# Patient Record
Sex: Female | Born: 1995 | Race: White | Hispanic: No | Marital: Single
Health system: Midwestern US, Academic
[De-identification: ages and names within clinical notes are randomized; demographics above are authoritative.]

---

## 2016-11-15 ENCOUNTER — Encounter: Admit: 2016-11-15 | Discharge: 2016-11-15 | Payer: MEDICAID

## 2016-11-15 NOTE — Progress Notes
Update to care everywhere. Patient scheduled for initial consult on 11.13.18. LMP 9.28.18,  Hep C.  Attempted to contact patient to inquire where Hep C was diagnosed and inquire of medical/pregnancy history.  No answer, LVM to call [facility] nurse.

## 2016-11-19 ENCOUNTER — Encounter: Admit: 2016-11-19 | Discharge: 2016-11-19 | Payer: MEDICAID

## 2016-11-19 DIAGNOSIS — O98419 Viral hepatitis complicating pregnancy, unspecified trimester: Principal | ICD-10-CM

## 2016-11-20 ENCOUNTER — Ambulatory Visit: Admit: 2016-11-20 | Discharge: 2016-11-21 | Payer: MEDICAID

## 2016-11-20 ENCOUNTER — Encounter: Admit: 2016-11-20 | Discharge: 2016-11-20 | Payer: MEDICAID

## 2016-11-20 DIAGNOSIS — O0991 Supervision of high risk pregnancy, unspecified, first trimester: Principal | ICD-10-CM

## 2016-11-20 DIAGNOSIS — B182 Chronic viral hepatitis C: ICD-10-CM

## 2016-11-20 DIAGNOSIS — Z8619 Personal history of other infectious and parasitic diseases: ICD-10-CM

## 2016-11-20 DIAGNOSIS — O98419 Viral hepatitis complicating pregnancy, unspecified trimester: Principal | ICD-10-CM

## 2016-11-20 DIAGNOSIS — F1510 Other stimulant abuse, uncomplicated: ICD-10-CM

## 2016-11-20 DIAGNOSIS — F1110 Opioid abuse, uncomplicated: ICD-10-CM

## 2016-11-20 LAB — CBC
Lab: 13 K/UL — ABNORMAL HIGH (ref 4.5–11.0)
Lab: 4.2 M/UL (ref 4.0–5.0)

## 2016-11-20 NOTE — Progress Notes
1. Have you or your sexual partner traveled away from [place] in the last 12 weeks? No
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
## 2016-11-21 LAB — COMPREHENSIVE METABOLIC PANEL
Lab: 0.5 mg/dL (ref >60)
Lab: 136 MMOL/L — ABNORMAL LOW (ref 137–147)
Lab: 14 U/L (ref 7–56)
Lab: 20 U/L (ref 7–40)
Lab: 22 MMOL/L (ref 21–30)
Lab: 3.9 MMOL/L (ref 3.5–5.1)
Lab: 4.4 g/dL (ref 3.5–5.0)
Lab: 6 (ref 3–12)
Lab: 83 mg/dL — ABNORMAL HIGH (ref 70–100)
Lab: >60 mL/min (ref >60)

## 2016-11-21 LAB — HEMOGLOBIN A1C: Lab: 5.8 % (ref 4.0–6.0)

## 2016-11-21 LAB — RUBELLA AB IGG

## 2016-11-21 LAB — HIV 1& 2 AG-AB SCRN W REFLEX HIV 1 PCR QUANT: Lab: NEGATIVE mg/dL — ABNORMAL LOW (ref 0.3–1.2)

## 2016-11-21 LAB — HEPATITIS C VIRAL LOAD PCR QUANT

## 2016-11-21 LAB — HEPATITIS B SURFACE AG: Lab: NEGATIVE mg/dL (ref 7–25)
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
## 2016-11-22 ENCOUNTER — Ambulatory Visit: Admit: 2016-11-22 | Discharge: 2016-11-22 | Payer: MEDICAID

## 2016-11-22 DIAGNOSIS — O0991 Supervision of high risk pregnancy, unspecified, first trimester: Principal | ICD-10-CM

## 2016-11-22 DIAGNOSIS — Z8619 Personal history of other infectious and parasitic diseases: ICD-10-CM

## 2016-11-22 LAB — CULTURE-URINE W/SENSITIVITY

## 2016-11-26 NOTE — Progress Notes
[facility] nurse noted    Algernon Huxley, MD sent to [facility]    She is likely immune, having cleared hep C. We will check additional viral loads in 2nd and 3rd trimester to confirm.

## 2016-12-11 ENCOUNTER — Ambulatory Visit: Admit: 2016-12-11 | Discharge: 2016-12-11 | Payer: MEDICAID

## 2016-12-11 ENCOUNTER — Ambulatory Visit: Admit: 2016-12-11 | Discharge: 2016-12-12 | Payer: MEDICAID

## 2016-12-11 ENCOUNTER — Encounter: Admit: 2016-12-11 | Discharge: 2016-12-11 | Payer: MEDICAID

## 2016-12-11 DIAGNOSIS — Z87898 Personal history of other specified conditions: Principal | ICD-10-CM

## 2016-12-11 DIAGNOSIS — Z3490 Encounter for supervision of normal pregnancy, unspecified, unspecified trimester: ICD-10-CM

## 2016-12-11 DIAGNOSIS — R7309 Other abnormal glucose: ICD-10-CM

## 2016-12-11 LAB — HEPATITIS B SURFACE AB

## 2016-12-11 LAB — HEPATITIS C ANTIBODY W REFLEX HCV PCR QUANT: Lab: NEGATIVE

## 2016-12-11 LAB — HEPATITIS A IGG: Lab: NEGATIVE

## 2016-12-12 LAB — CANNABINOIDS-URINE RANDOM: Lab: NEGATIVE

## 2016-12-12 LAB — AMPHETAMINES-URINE RANDOM: Lab: NEGATIVE

## 2016-12-12 LAB — OPIATES-URINE RANDOM: Lab: NEGATIVE

## 2016-12-12 LAB — CHLAM/NG PCR URINE
Lab: NEGATIVE
Lab: NEGATIVE

## 2016-12-12 LAB — BARBITURATES-URINE RANDOM: Lab: NEGATIVE

## 2016-12-12 LAB — COCAINE-URINE RANDOM: Lab: NEGATIVE

## 2016-12-12 LAB — SYPHILIS AB SCREEN: Lab: NEGATIVE

## 2016-12-12 LAB — PHENCYCLIDINES-URINE RANDOM: Lab: NEGATIVE

## 2016-12-12 LAB — BENZODIAZEPINES-URINE RANDOM: Lab: NEGATIVE

## 2016-12-14 NOTE — Progress Notes
Yvonne Armstrong presents for an ultrasound encounter. Past Medical, Surgical, Family & Social History; Medications & Allergies contained in the electronic record below were not reviewed today and may not be up-to-date. Please see [facility] report for all documentation related to this encounter.    12/14/2016  Kathyrn Lass, MA

## 2016-12-28 ENCOUNTER — Encounter: Admit: 2016-12-28 | Discharge: 2016-12-28 | Payer: MEDICAID

## 2016-12-28 DIAGNOSIS — O99331 Smoking (tobacco) complicating pregnancy, first trimester: ICD-10-CM

## 2016-12-28 DIAGNOSIS — Z87898 Personal history of other specified conditions: ICD-10-CM

## 2016-12-28 DIAGNOSIS — Z8619 Personal history of other infectious and parasitic diseases: ICD-10-CM

## 2016-12-28 DIAGNOSIS — Z3682 Encounter for antenatal screening for nuchal translucency: Principal | ICD-10-CM

## 2016-12-31 NOTE — Progress Notes
High Risk OB - Return Prenatal Visit  ???  Subjective:  Yvonne Armstrong???is a 21 y.o.???F G2P0100 at [redacted]w[redacted]d. ???Estimated Date of Delivery: 07/14/16. ???She presents today as a follow-up visit for history of hepatitis C and arnold-chiari malformation.  ???  +FM, -VB/LOF/CTX  ???  Objective:    ???  Vitals:    01/02/17 0931   BP: 118/68   Pulse: 91       Gen: NAD  CV: regular rate  Lungs: non-labored  Abd: soft, nttp  Ext: no LE edema, nttp BLE    01/02/17 [facility] U/S: NT 2.1 mm, no soft markers.     Prenatal Labs:  A POS, Ab neg  Rubella Immune  HIV Neg  G/C Neg/Neg  Syph Ab Neg  HBsAg Neg  Hep C viral load Neg  ???  Assessment  21 y.o.???F G1P0 @ [redacted]w[redacted]d by ultrasound 11/20/2016. Estimated Date of Delivery: 07/14/16.  History of HepC - likely cleared  H/o Heroin, Amphetamine use  Prior arnold chiari symptoms - none at moment.  ???  P:   Hepatitis C immune:  ??? Is not currently managed or treated. Was diagnosed in 2012 by [facility]   ??? Lab testing at initial visit c/w cleared infection - IgG neg, viral load neg  ??? LFTs each trimester - AST/ALT 20/14 (11/13)   ??? Normal abdominal U/S  ???  Tobacco Use  - counseled for cessation.    Prior Heroin/amphetamine use  - reviewed risk for IUGR and stillbirth with heroin.  Methamphetamine use is associated also with IUGR and placental abruption/stillbirth.  - reviewed options for methadone/buprenorphine therapy for opioid abuse.  - urine drug screen should be obtained periodically during prenatal care, requested 01/02/17  - growth u/s at minimum at 28 and 34-35 wks.  If actively using, should be q4 and weekly antenatal surveillance started if IUGR diagnosed.     Possible Chiari Malformation  - will request records from [facility]  - referral to [facility]  ???  Pregnancy:  ??? PNV  ??? PNLs, urine culture neg, urine G/C wnl  ??? Cervical cancer screening - pap in 2017 at [facility], patient said it was normal   ??? Request [facility] smear records [facility] ??? NT wnl - declined biochem.  Wants carrier screening.   ??? Recommend msAFP @16 -22wga  ??? Detailed [place] @20 -22wga  ??? 2hr GTT, CBC, Ab screen, syph ab @28  wks  ??? Recommend Tdap in 3rd trimester  ??? Collect GBS @35wga   ??? PP BCM: Discuss in third trimester   ??? Delivery: Discuss in third trimester   ???  RTC in 2 weeks    Patient seen and discussed with Dr. Ok Anis, MD  Obstetrics and Gynecology, PGY2  Pager - [phone]    I personally performed the key portions of the E/M visit, discussed case with Dr. Keane Police and concur with resident documentation of history, physical exam, assessment, and treatment plan unless otherwise noted.  IUP at [redacted]w[redacted]d - UDS for prior hx of heroin use.  Possible chiari hx - [facility].  Pregnancy - declined biochem, wants carrier screening. Finish 2h GTT today.  RTC 2 wks. msAFP then.  Offer vaccination for HepB and hepA next visit.   Algernon Huxley, MD              ???  ???

## 2017-01-02 ENCOUNTER — Ambulatory Visit: Admit: 2017-01-02 | Discharge: 2017-01-03 | Payer: MEDICAID

## 2017-01-02 ENCOUNTER — Encounter: Admit: 2017-01-02 | Discharge: 2017-01-02 | Payer: MEDICAID

## 2017-01-02 DIAGNOSIS — Z87898 Personal history of other specified conditions: ICD-10-CM

## 2017-01-02 DIAGNOSIS — F1110 Opioid abuse, uncomplicated: ICD-10-CM

## 2017-01-02 DIAGNOSIS — O0991 Supervision of high risk pregnancy, unspecified, first trimester: Principal | ICD-10-CM

## 2017-01-02 DIAGNOSIS — Z8669 Personal history of other diseases of the nervous system and sense organs: ICD-10-CM

## 2017-01-02 DIAGNOSIS — Q0700 Arnold-Chiari syndrome without spina bifida or hydrocephalus: ICD-10-CM

## 2017-01-02 DIAGNOSIS — F1510 Other stimulant abuse, uncomplicated: ICD-10-CM

## 2017-01-02 DIAGNOSIS — R7309 Other abnormal glucose: ICD-10-CM

## 2017-01-02 DIAGNOSIS — Z3682 Encounter for antenatal screening for nuchal translucency: Principal | ICD-10-CM

## 2017-01-02 DIAGNOSIS — Z3490 Encounter for supervision of normal pregnancy, unspecified, unspecified trimester: Principal | ICD-10-CM

## 2017-01-02 DIAGNOSIS — Z8619 Personal history of other infectious and parasitic diseases: ICD-10-CM

## 2017-01-02 DIAGNOSIS — O99331 Smoking (tobacco) complicating pregnancy, first trimester: ICD-10-CM

## 2017-01-02 DIAGNOSIS — Z3A12 12 weeks gestation of pregnancy: ICD-10-CM

## 2017-01-02 LAB — GLUCOSE TOL-2 HR: Lab: 106 mg/dL

## 2017-01-02 LAB — GLUCOSE, FASTING: Lab: 78 mg/dL (ref 70–100)

## 2017-01-02 LAB — GLUCOSE TOL-1 HR: Lab: 102 mg/dL

## 2017-01-02 NOTE — Progress Notes
1. Have you or your sexual partner traveled away from [place] in the last 12 weeks? No

## 2017-01-15 ENCOUNTER — Encounter: Admit: 2017-01-15 | Discharge: 2017-01-15 | Payer: MEDICAID

## 2017-01-16 NOTE — Progress Notes
Yvonne Armstrong presents for an ultrasound encounter. Past Medical, Surgical, Family & Social History; Medications & Allergies contained in the electronic record below were not reviewed today and may not be up-to-date. Please see [facility] report for all documentation related to this encounter.    01/16/2017  Kathyrn Lass, MA

## 2017-01-21 ENCOUNTER — Encounter: Admit: 2017-01-21 | Discharge: 2017-01-21 | Payer: MEDICAID

## 2017-01-21 DIAGNOSIS — Z1379 Encounter for other screening for genetic and chromosomal anomalies: Principal | ICD-10-CM

## 2017-01-24 ENCOUNTER — Encounter: Admit: 2017-01-24 | Discharge: 2017-01-24 | Payer: MEDICAID

## 2017-02-28 ENCOUNTER — Encounter: Admit: 2017-02-28 | Discharge: 2017-02-28 | Payer: MEDICAID

## 2017-02-28 ENCOUNTER — Ambulatory Visit: Admit: 2017-02-28 | Discharge: 2017-03-01 | Payer: MEDICAID

## 2017-02-28 DIAGNOSIS — Z363 Encounter for antenatal screening for malformations: Principal | ICD-10-CM

## 2017-02-28 DIAGNOSIS — R4189 Other symptoms and signs involving cognitive functions and awareness: Principal | ICD-10-CM

## 2017-02-28 DIAGNOSIS — H547 Unspecified visual loss: ICD-10-CM

## 2017-02-28 DIAGNOSIS — R51 Headache: ICD-10-CM

## 2017-02-28 MED ORDER — MAGNESIUM OXIDE 400 MG (241.3 MG MAGNESIUM) PO TAB
400 mg | ORAL_TABLET | Freq: Two times a day (BID) | ORAL | 3 refills | Status: SS
Start: 2017-02-28 — End: 2017-07-08

## 2017-02-28 MED ORDER — RIBOFLAVIN (VITAMIN B2) 400 MG PO TAB
400 mg | ORAL_TABLET | Freq: Every evening | ORAL | 3 refills | Status: AC
Start: 2017-02-28 — End: 2017-03-11

## 2017-03-01 ENCOUNTER — Encounter: Admit: 2017-03-01 | Discharge: 2017-03-01 | Payer: MEDICAID

## 2017-03-01 DIAGNOSIS — Z8669 Personal history of other diseases of the nervous system and sense organs: ICD-10-CM

## 2017-03-01 DIAGNOSIS — Q0700 Arnold-Chiari syndrome without spina bifida or hydrocephalus: Principal | ICD-10-CM

## 2017-03-09 ENCOUNTER — Ambulatory Visit: Admit: 2017-03-09 | Discharge: 2017-03-09 | Payer: MEDICAID

## 2017-03-09 DIAGNOSIS — Q0700 Arnold-Chiari syndrome without spina bifida or hydrocephalus: Principal | ICD-10-CM

## 2017-03-11 ENCOUNTER — Encounter: Admit: 2017-03-11 | Discharge: 2017-03-11 | Payer: MEDICAID

## 2017-03-11 MED ORDER — RIBOFLAVIN (VITAMIN B2) 100 MG PO TAB
ORAL_TABLET | 3 refills | Status: SS
Start: 2017-03-11 — End: 2017-07-08

## 2017-03-21 ENCOUNTER — Encounter: Admit: 2017-03-21 | Discharge: 2017-03-21 | Payer: MEDICAID

## 2017-03-21 DIAGNOSIS — G935 Compression of brain: Principal | ICD-10-CM

## 2017-04-09 ENCOUNTER — Ambulatory Visit: Admit: 2017-04-09 | Discharge: 2017-04-09 | Payer: MEDICAID

## 2017-04-09 DIAGNOSIS — Q0700 Arnold-Chiari syndrome without spina bifida or hydrocephalus: ICD-10-CM

## 2017-04-09 DIAGNOSIS — O98419 Viral hepatitis complicating pregnancy, unspecified trimester: ICD-10-CM

## 2017-04-09 DIAGNOSIS — F1510 Other stimulant abuse, uncomplicated: Principal | ICD-10-CM

## 2017-04-09 DIAGNOSIS — F1110 Opioid abuse, uncomplicated: ICD-10-CM

## 2017-04-16 ENCOUNTER — Ambulatory Visit: Admit: 2017-04-16 | Discharge: 2017-04-16 | Payer: MEDICAID

## 2017-04-16 ENCOUNTER — Encounter: Admit: 2017-04-16 | Discharge: 2017-04-16 | Payer: MEDICAID

## 2017-04-16 DIAGNOSIS — F1990 Other psychoactive substance use, unspecified, uncomplicated: ICD-10-CM

## 2017-04-16 DIAGNOSIS — Z131 Encounter for screening for diabetes mellitus: ICD-10-CM

## 2017-04-16 DIAGNOSIS — Z3A28 28 weeks gestation of pregnancy: ICD-10-CM

## 2017-04-16 DIAGNOSIS — Z8619 Personal history of other infectious and parasitic diseases: Principal | ICD-10-CM

## 2017-04-16 DIAGNOSIS — R51 Headache: ICD-10-CM

## 2017-04-16 DIAGNOSIS — Q0700 Arnold-Chiari syndrome without spina bifida or hydrocephalus: ICD-10-CM

## 2017-04-16 DIAGNOSIS — R4189 Other symptoms and signs involving cognitive functions and awareness: Principal | ICD-10-CM

## 2017-04-16 DIAGNOSIS — H547 Unspecified visual loss: ICD-10-CM

## 2017-04-16 LAB — OPIATES 300 OR GREATER-URINE RANDOM: Lab: NEGATIVE

## 2017-04-16 LAB — COMPREHENSIVE METABOLIC PANEL
Lab: 0.2 mg/dL — ABNORMAL LOW (ref 0.3–1.2)
Lab: 101 U/L (ref 25–110)
Lab: 133 MMOL/L — ABNORMAL LOW (ref 137–147)
Lab: 14 U/L (ref 7–56)
Lab: 21 U/L (ref 7–40)
Lab: 22 MMOL/L (ref 21–30)
Lab: 3.8 g/dL (ref 3.5–5.0)
Lab: >60 mL/min (ref >60)
Lab: >60 mL/min (ref >60)
Lab: 7 (ref 3–12)

## 2017-04-16 LAB — PHENCYCLIDINES-URINE RANDOM: Lab: NEGATIVE

## 2017-04-16 LAB — METHADONE-URINE SCREEN: Lab: NEGATIVE

## 2017-04-16 LAB — CANNABINOIDS-URINE RANDOM: Lab: NEGATIVE

## 2017-04-16 LAB — AMPHETAMINES-URINE RANDOM: Lab: NEGATIVE

## 2017-04-16 LAB — BENZODIAZEPINES-URINE RANDOM: Lab: NEGATIVE

## 2017-04-16 LAB — BARBITURATES-URINE RANDOM: Lab: NEGATIVE

## 2017-04-16 LAB — OXYCODONE URINE SCREEN: Lab: NEGATIVE

## 2017-04-16 LAB — COCAINE-URINE RANDOM: Lab: NEGATIVE

## 2017-04-17 LAB — HEPATITIS C VIRAL LOAD PCR QUANT

## 2017-04-17 MED ORDER — BUPROPION SR 150 MG PO TBSR
150 mg | ORAL_TABLET | Freq: Two times a day (BID) | ORAL | 3 refills | Status: SS
Start: 2017-04-17 — End: 2017-07-08

## 2017-05-02 ENCOUNTER — Encounter: Admit: 2017-05-02 | Discharge: 2017-05-02 | Payer: MEDICAID

## 2017-05-03 ENCOUNTER — Encounter: Admit: 2017-05-03 | Discharge: 2017-05-03 | Payer: MEDICAID

## 2017-05-03 DIAGNOSIS — Q0700 Arnold-Chiari syndrome without spina bifida or hydrocephalus: ICD-10-CM

## 2017-05-03 DIAGNOSIS — F1110 Opioid abuse, uncomplicated: ICD-10-CM

## 2017-05-03 DIAGNOSIS — Z8619 Personal history of other infectious and parasitic diseases: Principal | ICD-10-CM

## 2017-05-03 DIAGNOSIS — F1510 Other stimulant abuse, uncomplicated: ICD-10-CM

## 2017-05-07 ENCOUNTER — Encounter: Admit: 2017-05-07 | Discharge: 2017-05-07 | Payer: MEDICAID

## 2017-05-07 ENCOUNTER — Ambulatory Visit: Admit: 2017-05-07 | Discharge: 2017-05-08 | Payer: MEDICAID

## 2017-05-07 DIAGNOSIS — F1110 Opioid abuse, uncomplicated: ICD-10-CM

## 2017-05-07 DIAGNOSIS — O98419 Viral hepatitis complicating pregnancy, unspecified trimester: Principal | ICD-10-CM

## 2017-05-07 DIAGNOSIS — Q0700 Arnold-Chiari syndrome without spina bifida or hydrocephalus: ICD-10-CM

## 2017-05-07 DIAGNOSIS — Z87898 Personal history of other specified conditions: ICD-10-CM

## 2017-05-07 DIAGNOSIS — O0993 Supervision of high risk pregnancy, unspecified, third trimester: ICD-10-CM

## 2017-05-07 DIAGNOSIS — F1510 Other stimulant abuse, uncomplicated: ICD-10-CM

## 2017-05-07 DIAGNOSIS — R4189 Other symptoms and signs involving cognitive functions and awareness: Principal | ICD-10-CM

## 2017-05-07 DIAGNOSIS — Z3A28 28 weeks gestation of pregnancy: ICD-10-CM

## 2017-05-07 DIAGNOSIS — H547 Unspecified visual loss: ICD-10-CM

## 2017-05-07 DIAGNOSIS — Z131 Encounter for screening for diabetes mellitus: ICD-10-CM

## 2017-05-07 DIAGNOSIS — R51 Headache: ICD-10-CM

## 2017-05-07 DIAGNOSIS — Z8619 Personal history of other infectious and parasitic diseases: Principal | ICD-10-CM

## 2017-05-07 LAB — CBC
Lab: 13 % (ref 11–15)
Lab: 16 10*3/uL — ABNORMAL HIGH (ref 4.5–11.0)
Lab: 3.5 M/UL — ABNORMAL LOW (ref 4.0–5.0)
Lab: 30 pg (ref 26–34)
Lab: 33 % — ABNORMAL LOW (ref 36–45)
Lab: 7.5 FL (ref 7–11)

## 2017-05-07 LAB — AMPHETAMINES-URINE RANDOM: Lab: NEGATIVE

## 2017-05-07 LAB — CANNABINOIDS-URINE RANDOM: Lab: NEGATIVE

## 2017-05-07 LAB — PHENCYCLIDINES-URINE RANDOM: Lab: NEGATIVE g/dL (ref 32.0–36.0)

## 2017-05-07 LAB — GLUCOSE TOL-1 HR: Lab: 101 mg/dL — ABNORMAL LOW (ref 12.0–15.0)

## 2017-05-07 LAB — BENZODIAZEPINES-URINE RANDOM: Lab: NEGATIVE

## 2017-05-07 LAB — BARBITURATES-URINE RANDOM: Lab: NEGATIVE

## 2017-05-07 LAB — OPIATES 300 OR GREATER-URINE RANDOM: Lab: NEGATIVE

## 2017-05-07 LAB — METHADONE-URINE SCREEN: Lab: NEGATIVE

## 2017-05-07 LAB — OXYCODONE URINE SCREEN: Lab: NEGATIVE 10*3/uL — ABNORMAL HIGH (ref 150–400)

## 2017-05-07 LAB — COCAINE-URINE RANDOM: Lab: NEGATIVE

## 2017-05-08 LAB — SYPHILIS AB SCREEN: Lab: NEGATIVE FL (ref 80–100)

## 2017-05-21 ENCOUNTER — Ambulatory Visit: Admit: 2017-05-21 | Discharge: 2017-05-22 | Payer: MEDICAID

## 2017-05-21 ENCOUNTER — Encounter: Admit: 2017-05-21 | Discharge: 2017-05-21 | Payer: MEDICAID

## 2017-05-21 DIAGNOSIS — O0993 Supervision of high risk pregnancy, unspecified, third trimester: Principal | ICD-10-CM

## 2017-05-21 DIAGNOSIS — Z87898 Personal history of other specified conditions: ICD-10-CM

## 2017-05-21 DIAGNOSIS — R51 Headache: ICD-10-CM

## 2017-05-21 DIAGNOSIS — R4189 Other symptoms and signs involving cognitive functions and awareness: Principal | ICD-10-CM

## 2017-05-21 DIAGNOSIS — H547 Unspecified visual loss: ICD-10-CM

## 2017-05-21 LAB — AMPHETAMINES-URINE RANDOM: Lab: NEGATIVE g/dL (ref 13.5–16.5)

## 2017-05-21 LAB — BARBITURATES-URINE RANDOM: Lab: NEGATIVE FL (ref 80–100)

## 2017-05-21 LAB — METHADONE-URINE SCREEN: Lab: NEGATIVE % (ref 4–12)

## 2017-05-21 LAB — CANNABINOIDS-URINE RANDOM: Lab: NEGATIVE K/UL (ref 150–400)

## 2017-05-21 LAB — BENZODIAZEPINES-URINE RANDOM: Lab: NEGATIVE g/dL (ref 32.0–36.0)

## 2017-05-21 LAB — OPIATES 300 OR GREATER-URINE RANDOM: Lab: NEGATIVE % (ref 0–2)

## 2017-05-21 LAB — PHENCYCLIDINES-URINE RANDOM: Lab: NEGATIVE 10*3/uL — ABNORMAL HIGH (ref 0–0.45)

## 2017-05-21 LAB — OXYCODONE URINE SCREEN: Lab: NEGATIVE 10*3/uL (ref 1.0–4.8)

## 2017-05-21 LAB — COCAINE-URINE RANDOM: Lab: NEGATIVE % (ref 41–77)

## 2017-05-23 ENCOUNTER — Encounter: Admit: 2017-05-23 | Discharge: 2017-05-23 | Payer: MEDICAID

## 2017-05-23 DIAGNOSIS — R51 Headache: ICD-10-CM

## 2017-05-23 DIAGNOSIS — R4189 Other symptoms and signs involving cognitive functions and awareness: Principal | ICD-10-CM

## 2017-05-23 DIAGNOSIS — H547 Unspecified visual loss: ICD-10-CM

## 2017-05-31 ENCOUNTER — Encounter: Admit: 2017-05-31 | Discharge: 2017-05-31 | Payer: MEDICAID

## 2017-05-31 DIAGNOSIS — Q0700 Arnold-Chiari syndrome without spina bifida or hydrocephalus: ICD-10-CM

## 2017-05-31 DIAGNOSIS — Z87898 Personal history of other specified conditions: Principal | ICD-10-CM

## 2017-05-31 DIAGNOSIS — O98419 Viral hepatitis complicating pregnancy, unspecified trimester: ICD-10-CM

## 2017-06-04 ENCOUNTER — Ambulatory Visit: Admit: 2017-06-04 | Discharge: 2017-06-05 | Payer: MEDICAID

## 2017-06-04 ENCOUNTER — Encounter: Admit: 2017-06-04 | Discharge: 2017-06-04 | Payer: MEDICAID

## 2017-06-04 DIAGNOSIS — Z87898 Personal history of other specified conditions: Principal | ICD-10-CM

## 2017-06-04 DIAGNOSIS — O98419 Viral hepatitis complicating pregnancy, unspecified trimester: ICD-10-CM

## 2017-06-04 DIAGNOSIS — R4189 Other symptoms and signs involving cognitive functions and awareness: Principal | ICD-10-CM

## 2017-06-04 DIAGNOSIS — F1110 Opioid abuse, uncomplicated: ICD-10-CM

## 2017-06-04 DIAGNOSIS — Z3A34 34 weeks gestation of pregnancy: ICD-10-CM

## 2017-06-04 DIAGNOSIS — Q0700 Arnold-Chiari syndrome without spina bifida or hydrocephalus: ICD-10-CM

## 2017-06-04 DIAGNOSIS — R51 Headache: ICD-10-CM

## 2017-06-04 DIAGNOSIS — B182 Chronic viral hepatitis C: ICD-10-CM

## 2017-06-04 DIAGNOSIS — F1510 Other stimulant abuse, uncomplicated: ICD-10-CM

## 2017-06-04 DIAGNOSIS — O0993 Supervision of high risk pregnancy, unspecified, third trimester: ICD-10-CM

## 2017-06-04 DIAGNOSIS — G935 Compression of brain: ICD-10-CM

## 2017-06-04 DIAGNOSIS — H547 Unspecified visual loss: ICD-10-CM

## 2017-06-04 LAB — LIVER FUNCTION PANEL
Lab: 0.2 mg/dL — ABNORMAL LOW (ref 0.3–1.2)
Lab: 10 U/L (ref 7–56)
Lab: 14 U/L (ref 7–40)
Lab: 183 U/L — ABNORMAL HIGH (ref 25–110)
Lab: 3.6 g/dL (ref 3.5–5.0)
Lab: 6.5 g/dL (ref 6.0–8.0)

## 2017-06-05 LAB — HEPATITIS C VIRAL LOAD PCR QUANT

## 2017-06-07 ENCOUNTER — Encounter: Admit: 2017-06-07 | Discharge: 2017-06-07 | Payer: MEDICAID

## 2017-06-07 DIAGNOSIS — R4189 Other symptoms and signs involving cognitive functions and awareness: Principal | ICD-10-CM

## 2017-06-07 DIAGNOSIS — H547 Unspecified visual loss: ICD-10-CM

## 2017-06-07 DIAGNOSIS — R51 Headache: ICD-10-CM

## 2017-06-19 ENCOUNTER — Encounter: Admit: 2017-06-19 | Discharge: 2017-06-19 | Payer: MEDICAID

## 2017-06-19 ENCOUNTER — Ambulatory Visit: Admit: 2017-06-19 | Discharge: 2017-06-20 | Payer: MEDICAID

## 2017-06-19 DIAGNOSIS — R51 Headache: ICD-10-CM

## 2017-06-19 DIAGNOSIS — R4189 Other symptoms and signs involving cognitive functions and awareness: Principal | ICD-10-CM

## 2017-06-19 DIAGNOSIS — Q0700 Arnold-Chiari syndrome without spina bifida or hydrocephalus: Principal | ICD-10-CM

## 2017-06-19 DIAGNOSIS — H547 Unspecified visual loss: ICD-10-CM

## 2017-06-28 ENCOUNTER — Encounter: Admit: 2017-06-28 | Discharge: 2017-06-28 | Payer: MEDICAID

## 2017-06-28 DIAGNOSIS — Q0700 Arnold-Chiari syndrome without spina bifida or hydrocephalus: ICD-10-CM

## 2017-06-28 DIAGNOSIS — O98419 Viral hepatitis complicating pregnancy, unspecified trimester: Principal | ICD-10-CM

## 2017-06-28 DIAGNOSIS — Z87898 Personal history of other specified conditions: ICD-10-CM

## 2017-07-02 ENCOUNTER — Encounter: Admit: 2017-07-02 | Discharge: 2017-07-02 | Payer: MEDICAID

## 2017-07-02 DIAGNOSIS — H547 Unspecified visual loss: ICD-10-CM

## 2017-07-02 DIAGNOSIS — R51 Headache: ICD-10-CM

## 2017-07-02 DIAGNOSIS — F1510 Other stimulant abuse, uncomplicated: Secondary | ICD-10-CM

## 2017-07-02 DIAGNOSIS — O99333 Smoking (tobacco) complicating pregnancy, third trimester: Principal | ICD-10-CM

## 2017-07-02 DIAGNOSIS — R4189 Other symptoms and signs involving cognitive functions and awareness: Principal | ICD-10-CM

## 2017-07-02 DIAGNOSIS — O98419 Viral hepatitis complicating pregnancy, unspecified trimester: Secondary | ICD-10-CM

## 2017-07-02 DIAGNOSIS — B182 Chronic viral hepatitis C: Secondary | ICD-10-CM

## 2017-07-02 DIAGNOSIS — O0990 Supervision of high risk pregnancy, unspecified, unspecified trimester: Secondary | ICD-10-CM

## 2017-07-02 LAB — CANNABINOIDS-URINE RANDOM: Lab: NEGATIVE

## 2017-07-02 LAB — COCAINE-URINE RANDOM: Lab: NEGATIVE

## 2017-07-02 LAB — BENZODIAZEPINES-URINE RANDOM: Lab: NEGATIVE

## 2017-07-02 LAB — OXYCODONE URINE SCREEN: Lab: NEGATIVE

## 2017-07-02 LAB — OPIATES 300 OR GREATER-URINE RANDOM: Lab: NEGATIVE

## 2017-07-02 LAB — METHADONE-URINE SCREEN: Lab: NEGATIVE

## 2017-07-02 LAB — BARBITURATES-URINE RANDOM: Lab: NEGATIVE

## 2017-07-02 LAB — PHENCYCLIDINES-URINE RANDOM: Lab: NEGATIVE

## 2017-07-02 LAB — AMPHETAMINES-URINE RANDOM: Lab: NEGATIVE

## 2017-07-03 ENCOUNTER — Ambulatory Visit: Admit: 2017-07-02 | Discharge: 2017-07-02 | Payer: MEDICAID

## 2017-07-03 ENCOUNTER — Encounter: Admit: 2017-07-02 | Discharge: 2017-07-03 | Payer: MEDICAID

## 2017-07-03 ENCOUNTER — Ambulatory Visit: Admit: 2017-07-02 | Discharge: 2017-07-03 | Payer: MEDICAID

## 2017-07-03 DIAGNOSIS — O98419 Viral hepatitis complicating pregnancy, unspecified trimester: Secondary | ICD-10-CM

## 2017-07-03 DIAGNOSIS — Z87898 Personal history of other specified conditions: Secondary | ICD-10-CM

## 2017-07-03 DIAGNOSIS — Z3A38 38 weeks gestation of pregnancy: ICD-10-CM

## 2017-07-03 DIAGNOSIS — Q0700 Arnold-Chiari syndrome without spina bifida or hydrocephalus: ICD-10-CM

## 2017-07-03 DIAGNOSIS — O0990 Supervision of high risk pregnancy, unspecified, unspecified trimester: ICD-10-CM

## 2017-07-03 DIAGNOSIS — O358XX0 Maternal care for other (suspected) fetal abnormality and damage, not applicable or unspecified: ICD-10-CM

## 2017-07-03 DIAGNOSIS — F17200 Nicotine dependence, unspecified, uncomplicated: ICD-10-CM

## 2017-07-03 DIAGNOSIS — O98513 Other viral diseases complicating pregnancy, third trimester: ICD-10-CM

## 2017-07-03 DIAGNOSIS — G935 Compression of brain: ICD-10-CM

## 2017-07-03 DIAGNOSIS — F1510 Other stimulant abuse, uncomplicated: Secondary | ICD-10-CM

## 2017-07-03 DIAGNOSIS — O99333 Smoking (tobacco) complicating pregnancy, third trimester: Principal | ICD-10-CM

## 2017-07-03 DIAGNOSIS — B182 Chronic viral hepatitis C: ICD-10-CM

## 2017-07-04 ENCOUNTER — Encounter: Admit: 2017-07-04 | Discharge: 2017-07-04 | Payer: MEDICAID

## 2017-07-04 LAB — CULTURE-STREP SCREEN

## 2017-07-07 ENCOUNTER — Inpatient Hospital Stay: Admit: 2017-07-07 | Discharge: 2017-07-09 | Disposition: A | Discharge status: Home / Self Care | Payer: MEDICAID

## 2017-07-07 ENCOUNTER — Encounter: Admit: 2017-07-07 | Discharge: 2017-07-07 | Payer: MEDICAID

## 2017-07-07 ENCOUNTER — Inpatient Hospital Stay: Admit: 2017-07-07 | Discharge: 2017-07-07 | Payer: MEDICAID

## 2017-07-07 DIAGNOSIS — O09293 Supervision of pregnancy with other poor reproductive or obstetric history, third trimester: ICD-10-CM

## 2017-07-07 DIAGNOSIS — H547 Unspecified visual loss: ICD-10-CM

## 2017-07-07 DIAGNOSIS — R51 Headache: ICD-10-CM

## 2017-07-07 DIAGNOSIS — J45909 Unspecified asthma, uncomplicated: ICD-10-CM

## 2017-07-07 DIAGNOSIS — Z3490 Encounter for supervision of normal pregnancy, unspecified, unspecified trimester: Secondary | ICD-10-CM

## 2017-07-07 DIAGNOSIS — G935 Compression of brain: ICD-10-CM

## 2017-07-07 DIAGNOSIS — R4189 Other symptoms and signs involving cognitive functions and awareness: Principal | ICD-10-CM

## 2017-07-07 LAB — CBC
Lab: 14 10*3/uL — ABNORMAL HIGH (ref 4.5–11.0)
Lab: 3.9 M/UL — ABNORMAL LOW (ref 4.0–5.0)
Lab: 548 10*3/uL — ABNORMAL HIGH (ref 150–400)

## 2017-07-07 LAB — RUBELLA AB IGG

## 2017-07-07 MED ORDER — SIMETHICONE 80 MG PO CHEW
80 mg | ORAL | 0 refills | Status: CN | PRN
Start: 2017-07-07 — End: ?

## 2017-07-07 MED ORDER — MISOPROSTOL 200 MCG PO TAB
800 ug | Freq: Once | RECTAL | 0 refills | Status: CP
Start: 2017-07-07 — End: ?
  Administered 2017-07-08: 03:00:00 800 ug via RECTAL

## 2017-07-07 MED ORDER — LANOLIN TP CREA
TOPICAL | 0 refills | Status: CN | PRN
Start: 2017-07-07 — End: ?

## 2017-07-07 MED ORDER — NALOXONE 0.4 MG/ML IJ SOLN
.08 mg | INTRAVENOUS | 0 refills | Status: DC | PRN
Start: 2017-07-07 — End: 2017-07-09

## 2017-07-07 MED ORDER — IBUPROFEN 600 MG PO TAB
600 mg | ORAL | 0 refills | Status: CN
Start: 2017-07-07 — End: ?

## 2017-07-07 MED ORDER — MAGNESIUM HYDROXIDE 2,400 MG/10 ML PO SUSP
10 mL | ORAL | 0 refills | Status: CN | PRN
Start: 2017-07-07 — End: ?

## 2017-07-07 MED ORDER — PENICILLIN G POTASSIUM 5MU/100ML NS IVPB
5 10*6.[IU] | Freq: Once | INTRAVENOUS | 0 refills | Status: CP
Start: 2017-07-07 — End: ?
  Administered 2017-07-07 (×2): 5 10*6.[IU] via INTRAVENOUS

## 2017-07-07 MED ORDER — OXYTOCIN IN 0.9 % SOD CHLORIDE 30 UNIT/500 ML IV SOLN
30 [IU] | Freq: Once | INTRAVENOUS | 0 refills | Status: CN | PRN
Start: 2017-07-07 — End: ?

## 2017-07-07 MED ORDER — DOCUSATE SODIUM 100 MG PO CAP
100 mg | Freq: Two times a day (BID) | ORAL | 0 refills | Status: CN
Start: 2017-07-07 — End: ?

## 2017-07-07 MED ORDER — FAMOTIDINE 20 MG PO TAB
20 mg | Freq: Two times a day (BID) | ORAL | 0 refills | Status: CN
Start: 2017-07-07 — End: ?

## 2017-07-07 MED ORDER — ACETAMINOPHEN 325 MG PO TAB
650 mg | ORAL | 0 refills | Status: CN | PRN
Start: 2017-07-07 — End: ?

## 2017-07-07 MED ORDER — LACTATED RINGERS IV SOLP
INTRAVENOUS | 0 refills | Status: DC
Start: 2017-07-07 — End: 2017-07-08
  Administered 2017-07-07 (×2): 1000.000 mL via INTRAVENOUS

## 2017-07-07 MED ORDER — PENICILLIN G POTASSIUM IVPB
2.5 10*6.[IU] | INTRAVENOUS | 0 refills | Status: DC
Start: 2017-07-07 — End: 2017-07-08
  Administered 2017-07-07 (×2): 2.5 10*6.[IU] via INTRAVENOUS

## 2017-07-07 MED ORDER — LIDOCAINE (PF) 10 MG/ML (1 %) IJ SOLN (OR)
0 refills | Status: DC
Start: 2017-07-07 — End: 2017-07-08

## 2017-07-07 MED ORDER — LIDOCAINE-EPINEPHRINE (PF) 1.5 %-1:200,000 IJ SOLN (OR)
0 refills | Status: DC
Start: 2017-07-07 — End: 2017-07-08

## 2017-07-07 MED ORDER — OXYTOCIN IN 0.9 % SOD CHLORIDE 30 UNIT/500 ML IV SOLN
24 [IU] | Freq: Once | INTRAVENOUS | 0 refills | Status: DC
Start: 2017-07-07 — End: 2017-07-08

## 2017-07-07 MED ORDER — BUPROPION XL 150 MG PO TB24
150 mg | Freq: Two times a day (BID) | ORAL | 0 refills | Status: DC
Start: 2017-07-07 — End: 2017-07-08

## 2017-07-07 MED ORDER — FENTANYL/BUPIVACAINE 2 MCG/ML-0.1% PCA EPIDURAL SYRINGE
EPIDURAL | 0 refills | Status: DC
Start: 2017-07-07 — End: 2017-07-08
  Administered 2017-07-07 – 2017-07-08 (×2): 50.000 mL via EPIDURAL

## 2017-07-07 MED ORDER — OXYTOCIN IN 0.9 % SOD CHLORIDE 30 UNIT/500 ML IV SOLN
0.5-20 m[IU]/min | INTRAVENOUS | 0 refills | Status: DC
Start: 2017-07-07 — End: 2017-07-08
  Administered 2017-07-07: 19:00:00 2 m[IU]/min via INTRAVENOUS

## 2017-07-07 MED ORDER — SODIUM CITRATE-CITRIC ACID 500-334 MG/5 ML PO SOLN
30 mL | Freq: Once | ORAL | 0 refills | Status: AC | PRN
Start: 2017-07-07 — End: ?

## 2017-07-07 MED ORDER — DIPHTH,PERTUS(ACELL),TETANUS 2.5-8-5 LF-MCG-LF/0.5ML IM SUSP
.5 mL | Freq: Once | INTRAMUSCULAR | 0 refills | Status: CN
Start: 2017-07-07 — End: ?

## 2017-07-07 MED ORDER — BUPIVACAINE (PF) 0.25 % (2.5 MG/ML) IJ SOLN
0 refills | Status: DC
Start: 2017-07-07 — End: 2017-07-08
  Administered 2017-07-07: 23:00:00 3 mL via EPIDURAL

## 2017-07-08 LAB — BLOOD GASES-CORD BLOOD
Lab: 46 % (ref 32.0–36.0)
Lab: 6.7 MMOL/L (ref 26–34)
Lab: 7.3 g/dL — ABNORMAL LOW (ref 12.0–15.0)

## 2017-07-08 LAB — CBC
Lab: 11 g/dL — ABNORMAL LOW (ref 12.0–15.0)
Lab: 14 % (ref 11–15)
Lab: 20 10*3/uL — ABNORMAL HIGH (ref >60)
Lab: 3.7 M/UL — ABNORMAL LOW (ref 4.0–5.0)
Lab: 30 pg (ref 26–34)
Lab: 33 % — ABNORMAL LOW (ref 36–45)
Lab: 33 g/dL (ref 32.0–36.0)
Lab: 89 FL (ref 80–100)

## 2017-07-08 LAB — SYPHILIS AB SCREEN: Lab: NEGATIVE mmHg — ABNORMAL LOW (ref 36–45)

## 2017-07-08 MED ORDER — SIMETHICONE 80 MG PO CHEW
80 mg | ORAL | 0 refills | Status: DC | PRN
Start: 2017-07-08 — End: 2017-07-09

## 2017-07-08 MED ORDER — OXYTOCIN IN 0.9 % SOD CHLORIDE 30 UNIT/500 ML IV SOLN
30 [IU] | Freq: Once | INTRAVENOUS | 0 refills | Status: AC | PRN
Start: 2017-07-08 — End: ?

## 2017-07-08 MED ORDER — LANOLIN TP CREA
TOPICAL | 0 refills | Status: DC | PRN
Start: 2017-07-08 — End: 2017-07-09

## 2017-07-08 MED ORDER — MAGNESIUM HYDROXIDE 2,400 MG/10 ML PO SUSP
10 mL | ORAL | 0 refills | Status: DC | PRN
Start: 2017-07-08 — End: 2017-07-09

## 2017-07-08 MED ORDER — DOCUSATE SODIUM 100 MG PO CAP
100 mg | Freq: Two times a day (BID) | ORAL | 0 refills | Status: DC
Start: 2017-07-08 — End: 2017-07-09
  Administered 2017-07-08 – 2017-07-09 (×3): 100 mg via ORAL

## 2017-07-08 MED ORDER — ACETAMINOPHEN 325 MG PO TAB
650 mg | ORAL | 0 refills | Status: DC | PRN
Start: 2017-07-08 — End: 2017-07-09
  Administered 2017-07-08 – 2017-07-09 (×3): 650 mg via ORAL

## 2017-07-08 MED ORDER — DIPHTH,PERTUS(ACELL),TETANUS 2.5-8-5 LF-MCG-LF/0.5ML IM SUSP
.5 mL | Freq: Once | INTRAMUSCULAR | 0 refills | Status: DC
Start: 2017-07-08 — End: 2017-07-08

## 2017-07-08 MED ORDER — FAMOTIDINE 20 MG PO TAB
20 mg | Freq: Two times a day (BID) | ORAL | 0 refills | Status: DC
Start: 2017-07-08 — End: 2017-07-09
  Administered 2017-07-09 (×2): 20 mg via ORAL

## 2017-07-08 MED ORDER — IBUPROFEN 600 MG PO TAB
600 mg | ORAL | 0 refills | Status: DC
Start: 2017-07-08 — End: 2017-07-09
  Administered 2017-07-08 – 2017-07-09 (×4): 600 mg via ORAL

## 2017-07-09 ENCOUNTER — Inpatient Hospital Stay: Admit: 2017-07-07 | Discharge: 2017-07-07 | Payer: MEDICAID

## 2017-07-09 DIAGNOSIS — O99824 Streptococcus B carrier state complicating childbirth: Principal | ICD-10-CM

## 2017-07-09 DIAGNOSIS — O99354 Diseases of the nervous system complicating childbirth: ICD-10-CM

## 2017-07-09 DIAGNOSIS — Z3A39 39 weeks gestation of pregnancy: ICD-10-CM

## 2017-07-09 DIAGNOSIS — O99334 Smoking (tobacco) complicating childbirth: ICD-10-CM

## 2017-07-09 DIAGNOSIS — F17200 Nicotine dependence, unspecified, uncomplicated: ICD-10-CM

## 2017-07-09 DIAGNOSIS — O9952 Diseases of the respiratory system complicating childbirth: ICD-10-CM

## 2017-07-09 DIAGNOSIS — J45909 Unspecified asthma, uncomplicated: ICD-10-CM

## 2017-07-09 DIAGNOSIS — Z8759 Personal history of other complications of pregnancy, childbirth and the puerperium: ICD-10-CM

## 2017-07-09 DIAGNOSIS — G935 Compression of brain: ICD-10-CM

## 2017-07-09 MED ORDER — LANOLIN TP CREA
TOPICAL | 1 refills | Status: AC | PRN
Start: 2017-07-09 — End: 2017-09-13

## 2017-07-09 MED ORDER — DOCUSATE SODIUM 100 MG PO CAP
100 mg | ORAL_CAPSULE | Freq: Two times a day (BID) | ORAL | 3 refills | Status: AC
Start: 2017-07-09 — End: 2017-09-13

## 2017-07-09 MED ORDER — IBUPROFEN 600 MG PO TAB
600 mg | ORAL_TABLET | ORAL | 0 refills | Status: AC
Start: 2017-07-09 — End: ?

## 2017-07-12 ENCOUNTER — Encounter: Admit: 2017-07-12 | Discharge: 2017-07-12 | Payer: MEDICAID

## 2017-07-12 DIAGNOSIS — G935 Compression of brain: ICD-10-CM

## 2017-07-12 DIAGNOSIS — J45909 Unspecified asthma, uncomplicated: ICD-10-CM

## 2017-07-12 DIAGNOSIS — R4189 Other symptoms and signs involving cognitive functions and awareness: Principal | ICD-10-CM

## 2017-07-12 DIAGNOSIS — R51 Headache: ICD-10-CM

## 2017-07-12 DIAGNOSIS — H547 Unspecified visual loss: ICD-10-CM

## 2017-07-25 ENCOUNTER — Encounter: Admit: 2017-07-25 | Discharge: 2017-07-25 | Payer: MEDICAID

## 2017-08-09 ENCOUNTER — Ambulatory Visit: Admit: 2017-08-09 | Discharge: 2017-08-10 | Payer: MEDICAID

## 2017-08-09 ENCOUNTER — Encounter: Admit: 2017-08-09 | Discharge: 2017-08-09 | Payer: MEDICAID

## 2017-08-09 DIAGNOSIS — G935 Compression of brain: ICD-10-CM

## 2017-08-09 DIAGNOSIS — H547 Unspecified visual loss: ICD-10-CM

## 2017-08-09 DIAGNOSIS — R4189 Other symptoms and signs involving cognitive functions and awareness: Principal | ICD-10-CM

## 2017-08-09 DIAGNOSIS — R51 Headache: ICD-10-CM

## 2017-08-09 DIAGNOSIS — J45909 Unspecified asthma, uncomplicated: ICD-10-CM

## 2017-08-10 DIAGNOSIS — G935 Compression of brain: Principal | ICD-10-CM

## 2017-08-10 DIAGNOSIS — R9082 White matter disease, unspecified: ICD-10-CM

## 2017-08-18 ENCOUNTER — Ambulatory Visit: Admit: 2017-08-18 | Discharge: 2017-08-18 | Payer: MEDICAID

## 2017-08-18 DIAGNOSIS — G935 Compression of brain: Principal | ICD-10-CM

## 2017-08-18 DIAGNOSIS — R9082 White matter disease, unspecified: ICD-10-CM

## 2017-08-18 MED ORDER — GADOBENATE DIMEGLUMINE 529 MG/ML (0.1MMOL/0.2ML) IV SOLN
14 mL | Freq: Once | INTRAVENOUS | 0 refills | Status: CP
Start: 2017-08-18 — End: ?
  Administered 2017-08-18: 19:00:00 14 mL via INTRAVENOUS

## 2017-08-22 ENCOUNTER — Encounter: Admit: 2017-08-22 | Discharge: 2017-08-22 | Payer: MEDICAID

## 2017-08-22 ENCOUNTER — Ambulatory Visit: Admit: 2017-08-22 | Discharge: 2017-08-23 | Payer: MEDICAID

## 2017-08-22 DIAGNOSIS — R51 Headache: ICD-10-CM

## 2017-08-22 DIAGNOSIS — R4189 Other symptoms and signs involving cognitive functions and awareness: Principal | ICD-10-CM

## 2017-08-22 DIAGNOSIS — G935 Compression of brain: ICD-10-CM

## 2017-08-22 DIAGNOSIS — J45909 Unspecified asthma, uncomplicated: ICD-10-CM

## 2017-08-22 DIAGNOSIS — H547 Unspecified visual loss: ICD-10-CM

## 2017-08-22 MED ORDER — MEDROXYPROGESTERONE 150 MG/ML IM SYRG
150 mg | Freq: Once | INTRAMUSCULAR | 0 refills | Status: CP
Start: 2017-08-22 — End: ?
  Administered 2017-08-22: 16:00:00 150 mg via INTRAMUSCULAR

## 2017-08-23 DIAGNOSIS — Z793 Long term (current) use of hormonal contraceptives: Principal | ICD-10-CM

## 2017-09-05 ENCOUNTER — Encounter: Admit: 2017-09-05 | Discharge: 2017-09-05 | Payer: MEDICAID

## 2017-09-05 DIAGNOSIS — G35 Multiple sclerosis: Principal | ICD-10-CM

## 2017-09-13 ENCOUNTER — Ambulatory Visit: Admit: 2017-09-13 | Discharge: 2017-09-14 | Payer: MEDICAID

## 2017-09-13 ENCOUNTER — Encounter: Admit: 2017-09-13 | Discharge: 2017-09-13 | Payer: MEDICAID

## 2017-09-13 DIAGNOSIS — R51 Headache: ICD-10-CM

## 2017-09-13 DIAGNOSIS — R200 Anesthesia of skin: Secondary | ICD-10-CM

## 2017-09-13 DIAGNOSIS — H547 Unspecified visual loss: ICD-10-CM

## 2017-09-13 DIAGNOSIS — J45909 Unspecified asthma, uncomplicated: ICD-10-CM

## 2017-09-13 DIAGNOSIS — R4189 Other symptoms and signs involving cognitive functions and awareness: Principal | ICD-10-CM

## 2017-09-13 DIAGNOSIS — G935 Compression of brain: ICD-10-CM

## 2017-09-14 DIAGNOSIS — R9082 White matter disease, unspecified: ICD-10-CM

## 2017-09-14 DIAGNOSIS — R202 Paresthesia of skin: ICD-10-CM

## 2017-09-14 DIAGNOSIS — R51 Headache: Principal | ICD-10-CM

## 2017-09-14 DIAGNOSIS — G935 Compression of brain: ICD-10-CM

## 2017-09-16 ENCOUNTER — Encounter: Admit: 2017-09-16 | Discharge: 2017-09-16 | Payer: MEDICAID

## 2017-09-16 DIAGNOSIS — R51 Headache: ICD-10-CM

## 2017-09-16 DIAGNOSIS — J45909 Unspecified asthma, uncomplicated: ICD-10-CM

## 2017-09-16 DIAGNOSIS — G935 Compression of brain: ICD-10-CM

## 2017-09-16 DIAGNOSIS — H547 Unspecified visual loss: ICD-10-CM

## 2017-09-16 DIAGNOSIS — R4189 Other symptoms and signs involving cognitive functions and awareness: Principal | ICD-10-CM

## 2017-10-21 ENCOUNTER — Ambulatory Visit: Admit: 2017-10-21 | Discharge: 2017-10-22 | Payer: MEDICAID

## 2017-10-21 ENCOUNTER — Encounter: Admit: 2017-10-21 | Discharge: 2017-10-21 | Payer: MEDICAID

## 2017-10-21 DIAGNOSIS — H547 Unspecified visual loss: ICD-10-CM

## 2017-10-21 DIAGNOSIS — R4189 Other symptoms and signs involving cognitive functions and awareness: Principal | ICD-10-CM

## 2017-10-21 DIAGNOSIS — J45909 Unspecified asthma, uncomplicated: ICD-10-CM

## 2017-10-21 DIAGNOSIS — G43019 Migraine without aura, intractable, without status migrainosus: Principal | ICD-10-CM

## 2017-10-21 DIAGNOSIS — R51 Headache: ICD-10-CM

## 2017-10-21 DIAGNOSIS — G935 Compression of brain: ICD-10-CM

## 2017-10-21 MED ORDER — TOPIRAMATE 25 MG PO TAB
25 mg | ORAL_TABLET | Freq: Two times a day (BID) | ORAL | 3 refills | Status: AC
Start: 2017-10-21 — End: 2017-12-13

## 2017-10-22 DIAGNOSIS — R51 Headache: Secondary | ICD-10-CM

## 2017-10-22 DIAGNOSIS — M7918 Myalgia, other site: ICD-10-CM

## 2017-10-30 ENCOUNTER — Encounter: Admit: 2017-10-30 | Discharge: 2017-10-30 | Payer: MEDICAID

## 2017-10-30 ENCOUNTER — Ambulatory Visit: Admit: 2017-10-30 | Discharge: 2017-10-31 | Payer: MEDICAID

## 2017-10-30 DIAGNOSIS — J45909 Unspecified asthma, uncomplicated: ICD-10-CM

## 2017-10-30 DIAGNOSIS — G935 Compression of brain: ICD-10-CM

## 2017-10-30 DIAGNOSIS — R4189 Other symptoms and signs involving cognitive functions and awareness: Principal | ICD-10-CM

## 2017-10-30 DIAGNOSIS — H547 Unspecified visual loss: ICD-10-CM

## 2017-10-30 DIAGNOSIS — R51 Headache: ICD-10-CM

## 2017-10-31 ENCOUNTER — Encounter: Admit: 2017-10-31 | Discharge: 2017-10-31 | Payer: MEDICAID

## 2017-10-31 DIAGNOSIS — R51 Headache: ICD-10-CM

## 2017-10-31 DIAGNOSIS — G939 Disorder of brain, unspecified: ICD-10-CM

## 2017-10-31 DIAGNOSIS — G935 Compression of brain: ICD-10-CM

## 2017-10-31 DIAGNOSIS — Q0700 Arnold-Chiari syndrome without spina bifida or hydrocephalus: Principal | ICD-10-CM

## 2017-10-31 DIAGNOSIS — J45909 Unspecified asthma, uncomplicated: ICD-10-CM

## 2017-10-31 DIAGNOSIS — H547 Unspecified visual loss: ICD-10-CM

## 2017-10-31 DIAGNOSIS — R4189 Other symptoms and signs involving cognitive functions and awareness: Principal | ICD-10-CM

## 2017-11-14 ENCOUNTER — Encounter: Admit: 2017-11-14 | Discharge: 2017-11-14 | Payer: MEDICAID

## 2017-11-21 ENCOUNTER — Encounter: Admit: 2017-11-21 | Discharge: 2017-11-21 | Payer: MEDICAID

## 2017-12-13 ENCOUNTER — Encounter: Admit: 2017-12-13 | Discharge: 2017-12-13 | Payer: MEDICAID

## 2017-12-13 ENCOUNTER — Ambulatory Visit: Admit: 2017-12-13 | Discharge: 2017-12-14 | Payer: MEDICAID

## 2017-12-13 DIAGNOSIS — R9082 White matter disease, unspecified: Secondary | ICD-10-CM

## 2017-12-13 DIAGNOSIS — R4189 Other symptoms and signs involving cognitive functions and awareness: Principal | ICD-10-CM

## 2017-12-13 DIAGNOSIS — J45909 Unspecified asthma, uncomplicated: ICD-10-CM

## 2017-12-13 DIAGNOSIS — G935 Compression of brain: ICD-10-CM

## 2017-12-13 DIAGNOSIS — R51 Headache: ICD-10-CM

## 2017-12-13 DIAGNOSIS — H547 Unspecified visual loss: ICD-10-CM

## 2017-12-13 MED ORDER — DIPHENHYDRAMINE HCL 25 MG PO CAP
25-50 mg | ORAL_CAPSULE | ORAL | 0 refills | 6.00000 days | Status: AC | PRN
Start: 2017-12-13 — End: ?

## 2017-12-13 MED ORDER — MAGNESIUM OXIDE 400 MG (241.3 MG MAGNESIUM) PO TAB
400 mg | Freq: Every day | ORAL | 0 refills | Status: AC
Start: 2017-12-13 — End: ?

## 2017-12-13 MED ORDER — RIBOFLAVIN (VITAMIN B2) 400 MG PO TAB
400 mg | Freq: Every day | ORAL | 0 refills | Status: AC
Start: 2017-12-13 — End: ?

## 2017-12-13 MED ORDER — NAPROXEN 250 MG PO TAB
750 mg | ORAL_TABLET | Freq: Once | ORAL | 0 refills | Status: AC
Start: 2017-12-13 — End: ?

## 2017-12-13 MED ORDER — TOPIRAMATE 50 MG PO TAB
50 mg | ORAL_TABLET | Freq: Two times a day (BID) | ORAL | 5 refills | Status: AC
Start: 2017-12-13 — End: 2018-05-16

## 2017-12-14 DIAGNOSIS — Q0700 Arnold-Chiari syndrome without spina bifida or hydrocephalus: ICD-10-CM

## 2017-12-14 DIAGNOSIS — R51 Headache: ICD-10-CM

## 2017-12-14 DIAGNOSIS — Z3009 Encounter for other general counseling and advice on contraception: Principal | ICD-10-CM

## 2018-05-15 ENCOUNTER — Encounter: Admit: 2018-05-15 | Discharge: 2018-05-15 | Payer: MEDICAID

## 2018-05-16 ENCOUNTER — Encounter: Admit: 2018-05-16 | Discharge: 2018-05-16 | Payer: MEDICAID

## 2018-05-16 DIAGNOSIS — H547 Unspecified visual loss: ICD-10-CM

## 2018-05-16 DIAGNOSIS — R51 Headache: ICD-10-CM

## 2018-05-16 DIAGNOSIS — R4189 Other symptoms and signs involving cognitive functions and awareness: Principal | ICD-10-CM

## 2018-05-16 DIAGNOSIS — G935 Compression of brain: ICD-10-CM

## 2018-05-16 DIAGNOSIS — J45909 Unspecified asthma, uncomplicated: ICD-10-CM

## 2018-05-16 MED ORDER — TOPIRAMATE 25 MG PO TAB
ORAL_TABLET | ORAL | 0 refills | Status: AC
Start: 2018-05-16 — End: ?

## 2018-05-16 MED ORDER — TOPIRAMATE 25 MG PO TAB
75 mg | ORAL_TABLET | Freq: Two times a day (BID) | ORAL | 5 refills | Status: AC
Start: 2018-05-16 — End: ?

## 2018-05-16 MED ORDER — SUMATRIPTAN SUCCINATE 25 MG PO TAB
25 mg | ORAL_TABLET | ORAL | 1 refills | Status: DC | PRN
Start: 2018-05-16 — End: 2018-08-14

## 2018-05-16 NOTE — Progress Notes
Telehealth Visit Note    Date of Service: 05/16/2018    Subjective:      Obtained patient's verbal consent to treat them and their agreement to [facility] financial policy and NPP via this telehealth visit during the [facility]  This was a zoom telehealth encounter       Yvonne Armstrong is a 23 y.o. female.    History of Present Illness    23 yo F presenting for routine follow-up of headache.    Patient was last seen in clinic in Dec 2019, at that time her headaches were signfiicantly improved on topomax, which was titrated up to see if better control could be achieved.  Unfortunately since that time, the patient has had an increase in her migraine frequency.    Patient describes her headache as a stabbing sharp pain the back of her neck and radiates both up into the back of her head as well as into her back.  Frequency is 3-4/week and can last the whole day. + photo/phonophobia, no aura, not pulsatile, + nausea but no vomiting, worse with head movement/position.     Mg and riboflavin - stopped taking due to efficacy    Not taking anything for headaches currently other than topoxam  No relief from oral cocktails (NSAID, Mg, Benadryl).         Review of Systems   Allergic/Immunologic: Positive for environmental allergies.   Neurological: Positive for headaches. Dizziness:    All other systems reviewed and are negative.        Objective:         ??? diphenhydrAMINE (BENADRYL) 25 mg capsule Take one capsule to two capsules by mouth every 6 hours as needed. Indications: Migraine   ??? ibuprofen (MOTRIN) 600 mg tablet Take one tablet by mouth every 6 hours. Take with food.   ??? magnesium oxide (MAG-OX) 400 mg (241.3 mg magnesium) tablet Take one tablet by mouth daily.   ??? medroxyPROGESTERone (contraceptive) (DEPO-PROVERA) 150mg /1 mL syringe Inject 150 mg into the muscle every 90 days.   ??? riboflavin (vitamin B2) 400 mg tab Take one tablet by mouth daily.

## 2018-05-17 ENCOUNTER — Ambulatory Visit: Admit: 2018-05-16 | Discharge: 2018-05-17 | Payer: MEDICAID

## 2018-05-17 DIAGNOSIS — G935 Compression of brain: ICD-10-CM

## 2018-05-17 DIAGNOSIS — M5481 Occipital neuralgia: Principal | ICD-10-CM

## 2018-05-19 ENCOUNTER — Encounter: Admit: 2018-05-19 | Discharge: 2018-05-19 | Payer: MEDICAID

## 2018-05-19 DIAGNOSIS — H547 Unspecified visual loss: ICD-10-CM

## 2018-05-19 DIAGNOSIS — R51 Headache: ICD-10-CM

## 2018-05-19 DIAGNOSIS — G935 Compression of brain: ICD-10-CM

## 2018-05-19 DIAGNOSIS — J45909 Unspecified asthma, uncomplicated: ICD-10-CM

## 2018-05-19 DIAGNOSIS — R4189 Other symptoms and signs involving cognitive functions and awareness: Principal | ICD-10-CM

## 2018-05-22 NOTE — Progress Notes
I have personally interviewed Yvonne Armstrong and reviewed the history, impression, and plan of care as outlined by Dr.Muhtadi M. Islam, MD. I personally  participated in patient counseling and coordination of care.  I agree with the assessment and plan as documented by Dr. Thelma Barge. Islam, MD.

## 2018-07-17 ENCOUNTER — Encounter: Admit: 2018-07-17 | Discharge: 2018-07-17

## 2018-07-17 ENCOUNTER — Ambulatory Visit: Admit: 2018-07-17 | Discharge: 2018-07-18

## 2018-07-17 DIAGNOSIS — J45909 Unspecified asthma, uncomplicated: Secondary | ICD-10-CM

## 2018-07-17 DIAGNOSIS — G935 Compression of brain: Secondary | ICD-10-CM

## 2018-07-17 DIAGNOSIS — R51 Headache: Secondary | ICD-10-CM

## 2018-07-17 DIAGNOSIS — G43019 Migraine without aura, intractable, without status migrainosus: Principal | ICD-10-CM

## 2018-07-17 DIAGNOSIS — M7918 Myalgia, other site: Secondary | ICD-10-CM

## 2018-07-17 DIAGNOSIS — R4189 Other symptoms and signs involving cognitive functions and awareness: Secondary | ICD-10-CM

## 2018-07-17 DIAGNOSIS — H547 Unspecified visual loss: Secondary | ICD-10-CM

## 2018-07-17 DIAGNOSIS — M5481 Occipital neuralgia: Secondary | ICD-10-CM

## 2018-07-17 MED ORDER — TOPIRAMATE 50 MG PO TAB
50 mg | ORAL_TABLET | Freq: Three times a day (TID) | ORAL | 3 refills | Status: DC
Start: 2018-07-17 — End: 2018-09-22

## 2018-07-17 MED ORDER — DICLOFENAC SODIUM 75 MG PO TBEC
75 mg | ORAL_TABLET | Freq: Two times a day (BID) | ORAL | 3 refills | 60.00000 days | Status: DC
Start: 2018-07-17 — End: 2018-09-22

## 2018-07-17 MED ORDER — PROMETHAZINE 12.5 MG PO TAB
12.5 mg | ORAL_TABLET | Freq: Two times a day (BID) | ORAL | 3 refills | 7.00000 days | Status: AC | PRN
Start: 2018-07-17 — End: ?

## 2018-08-14 ENCOUNTER — Encounter: Admit: 2018-08-14 | Discharge: 2018-08-14

## 2018-08-14 MED ORDER — SUMATRIPTAN SUCCINATE 25 MG PO TAB
ORAL_TABLET | SUBCUTANEOUS | 1 refills | 30.00000 days | Status: AC | PRN
Start: 2018-08-14 — End: ?

## 2018-09-22 ENCOUNTER — Ambulatory Visit: Admit: 2018-09-22 | Discharge: 2018-09-23 | Payer: MEDICAID

## 2018-09-22 ENCOUNTER — Encounter: Admit: 2018-09-22 | Discharge: 2018-09-22 | Payer: MEDICAID

## 2018-09-22 MED ORDER — DICLOFENAC SODIUM 75 MG PO TBEC
75 mg | ORAL_TABLET | Freq: Two times a day (BID) | ORAL | 3 refills | 60.00000 days | Status: AC
Start: 2018-09-22 — End: ?

## 2018-09-22 MED ORDER — TOPIRAMATE 50 MG PO TAB
50 mg | ORAL_TABLET | Freq: Three times a day (TID) | ORAL | 3 refills | Status: AC
Start: 2018-09-22 — End: ?

## 2019-03-27 ENCOUNTER — Encounter: Admit: 2019-03-27 | Discharge: 2019-03-27 | Payer: MEDICAID

## 2019-03-30 ENCOUNTER — Ambulatory Visit: Admit: 2019-03-30 | Discharge: 2019-03-31 | Payer: MEDICAID

## 2019-03-30 ENCOUNTER — Encounter: Admit: 2019-03-30 | Discharge: 2019-03-30 | Payer: MEDICAID

## 2019-03-30 DIAGNOSIS — M7918 Myalgia, other site: Secondary | ICD-10-CM

## 2019-03-30 DIAGNOSIS — R4189 Other symptoms and signs involving cognitive functions and awareness: Secondary | ICD-10-CM

## 2019-03-30 DIAGNOSIS — M5481 Occipital neuralgia: Secondary | ICD-10-CM

## 2019-03-30 DIAGNOSIS — H547 Unspecified visual loss: Secondary | ICD-10-CM

## 2019-03-30 DIAGNOSIS — J45909 Unspecified asthma, uncomplicated: Secondary | ICD-10-CM

## 2019-03-30 DIAGNOSIS — G43019 Migraine without aura, intractable, without status migrainosus: Secondary | ICD-10-CM

## 2019-03-30 DIAGNOSIS — G935 Compression of brain: Secondary | ICD-10-CM

## 2019-03-30 DIAGNOSIS — R519 Generalized headaches: Secondary | ICD-10-CM

## 2019-09-30 ENCOUNTER — Encounter: Admit: 2019-09-30 | Discharge: 2019-09-30 | Payer: MEDICAID

## 2021-11-20 IMAGING — CT ABDOMEN_PELVIS W(Adult)
2 of 3 series · 12 of 46 positions shown, 14 images · IV contrast (Omnipaque)
Comparison: none

Ref. Physician: Billie, Che Wah

Gender: F
PROCEDURE: ABDOMEN_PELVIS W(Adult)
HISTORY: weakness, abd pain with weight loss.
Creat 0.70 GFR 97 Omni 300/100ml CTDI
TECHNIQUE: Axial CT imaging of the abdomen and pelvis was performed with IV contrast.
This exam was performed using one or more the following dose reduction techniques:
Automated exposure control, adjustment of the mA and/or KV according to the patient's
size or use of iterative reconstruction technique. Total DLP dose measures 125 mGy with a
total CTDI dose measuring 5 mGy.

[Series 2: abdomen_pelvis ax 3.00 br40 s3 · axial · 0.40mm/px · z∈[+1393,+1723]mm · 9 of 128 slices shown, 11 images]
[im 9/128  soft-tissue]
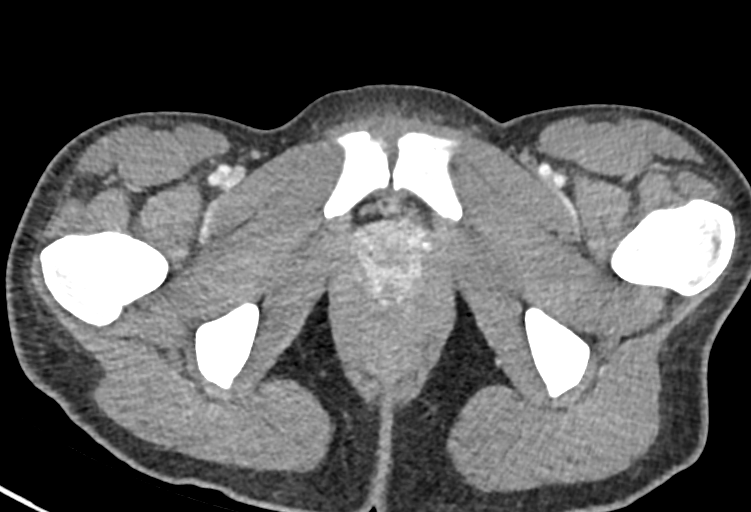
[im 9/128  bone]
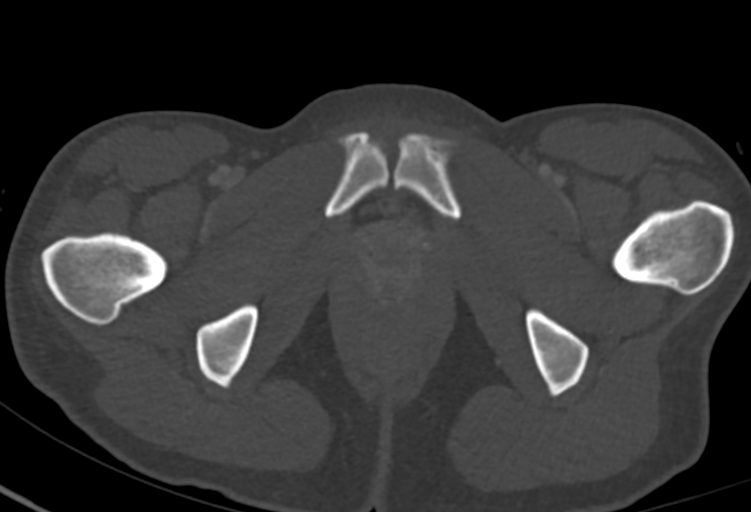
[im 25/128  soft-tissue]
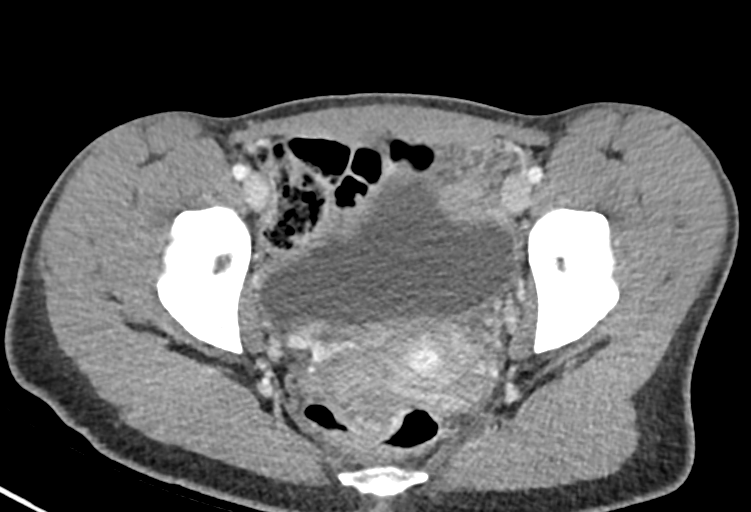
[im 37/128  soft-tissue]
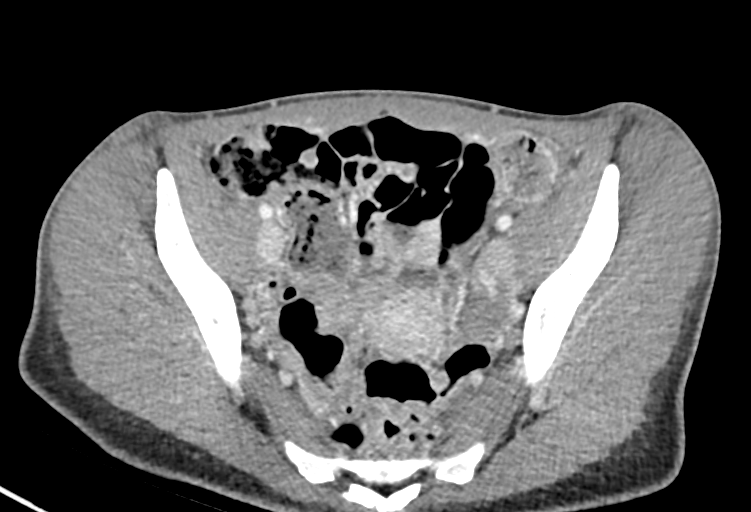
[im 50/128  soft-tissue]
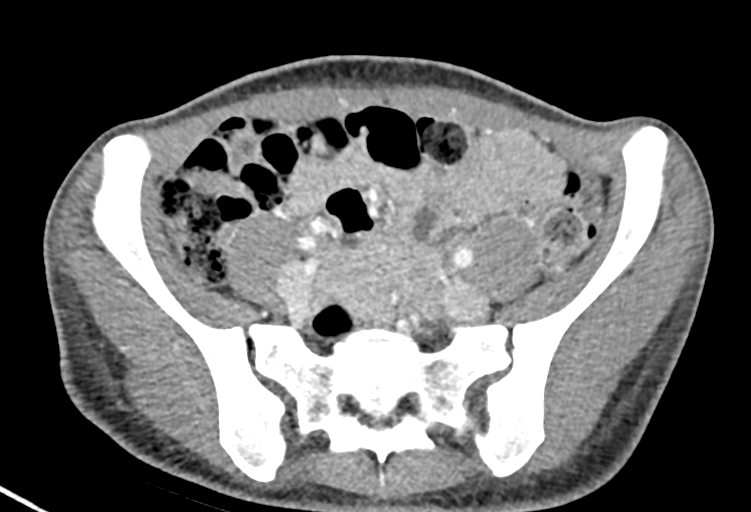
[im 66/128  soft-tissue]
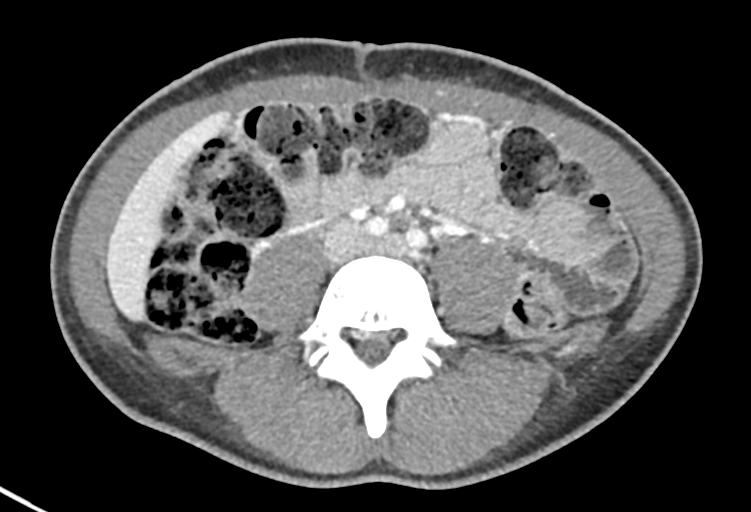
[im 78/128  soft-tissue]
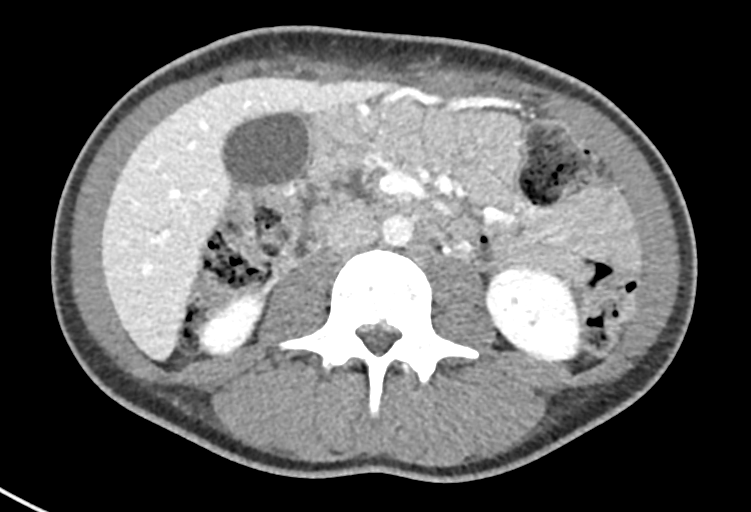
[im 91/128  soft-tissue]
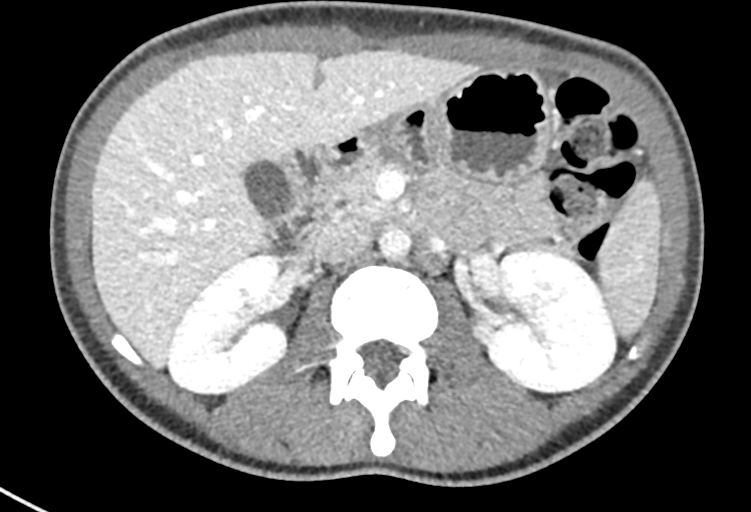
[im 107/128  soft-tissue]
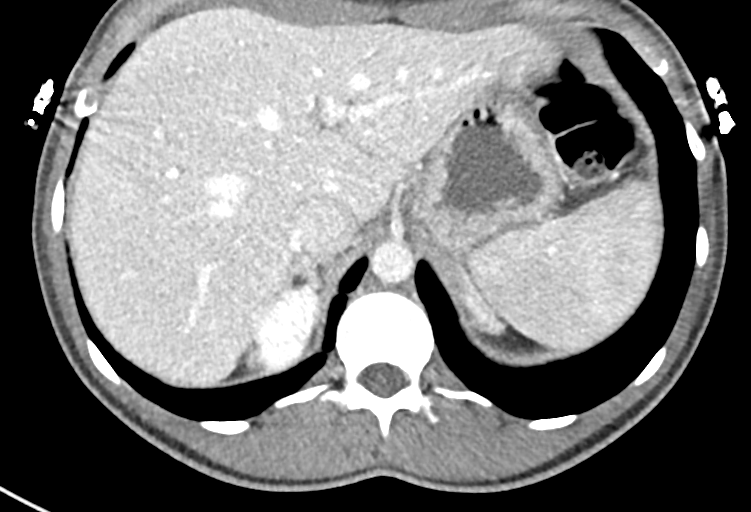
[im 119/128  soft-tissue]
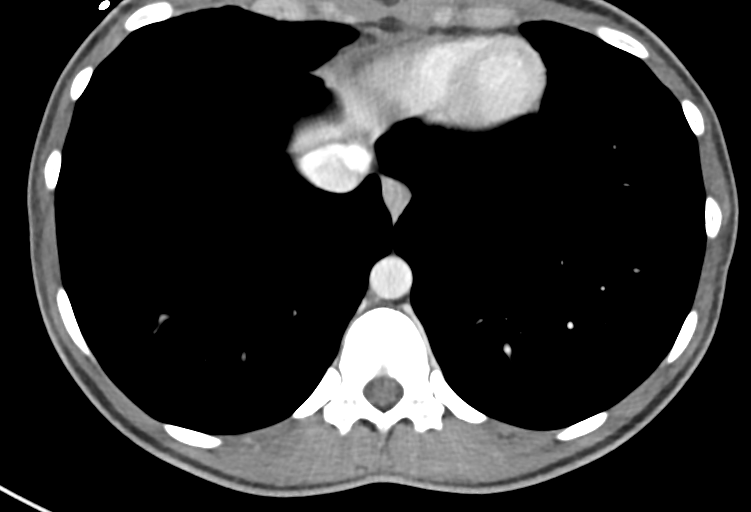
[im 119/128  bone]
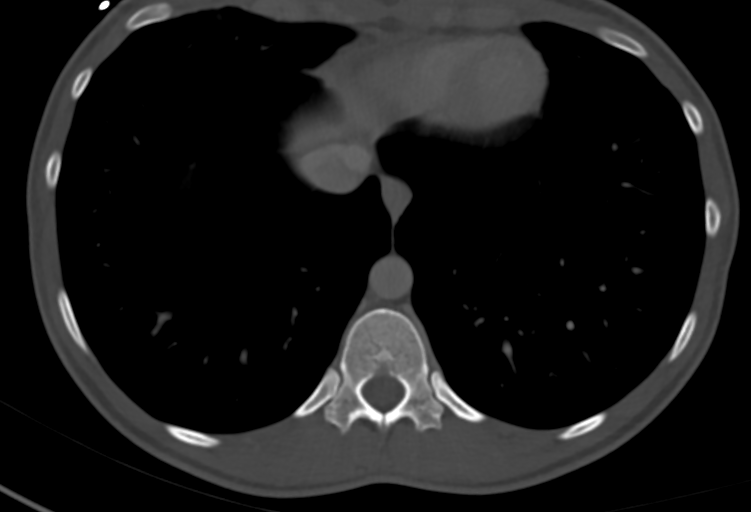

[Series 4: abdomen_pelvis cor 3.00 br40 s3 · coronal · 0.58mm/px · 3 of 68 slices shown]
[im 23/68  soft-tissue]
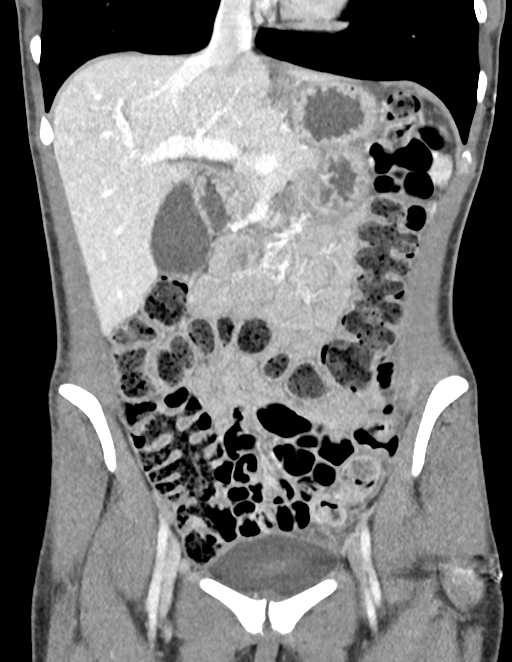
[im 30/68  soft-tissue]
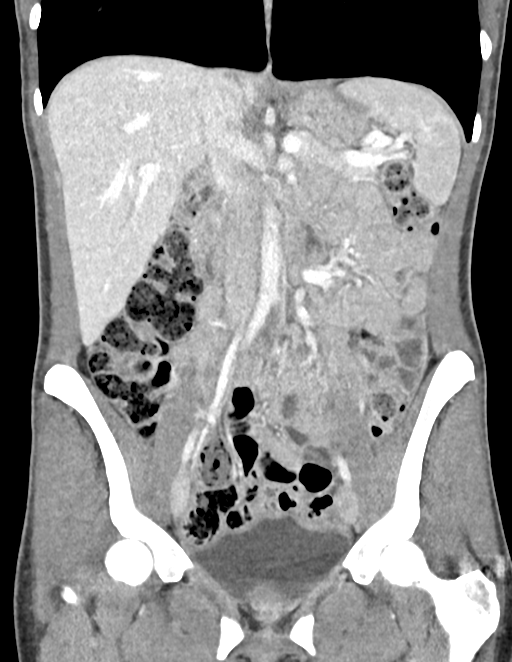
[im 38/68  soft-tissue]
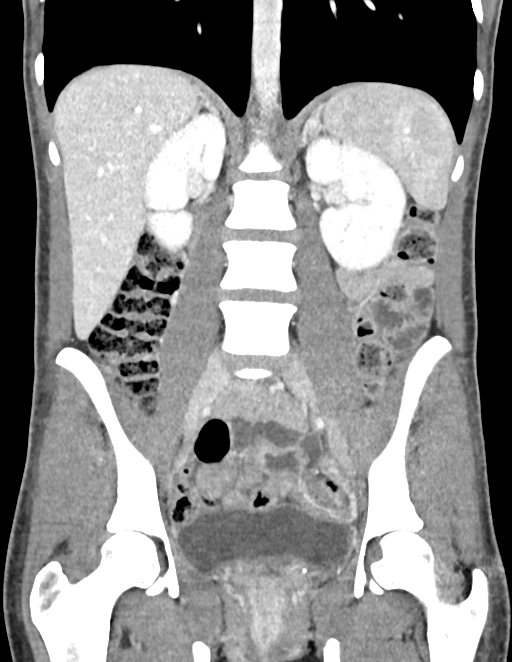

[12 of 46 positions shown; findings below may reference images not displayed]

FINDINGS: The lung bases shows right middle lobe atelectasis. Calculus is seen within the
gallbladder. The liver, spleen, pancreas, and adrenals are within normal limits. There is no
intrahepatic ductal dilatation seen. No mesenteric or retroperitoneal adenopathy is seen.
The right kidney shows no hydronephrosis or perinephric fat stranding. The left kidney
shows no hydronephrosis or perinephric fat stranding. The aorta is normal in course and
caliber. No pathologically dilated loops of large or small bowel are seen. Large amount of
stool seen throughout the colon. The appendix is not definitively seen in the right lower
quadrant. There is no free air or free fluid seen in the abdomen. The contrasted urinary
bladder is unremarkable. Uterus and ovaries are present.
IMPRESSION: 1. There is no hydronephrosis or perinephric fat stranding seen.
2. Nonobstructive bowel gas pattern without free air. Large amount of stool is seen
throughout the colon.
3. Cholelithiasis is seen, follow-up sonogram recommended.

Tech Notes:

weakness, abd pain, weight loss.
Creat 0.70 GFR 97 Omni 300/100ml CTDI
No priors
TR

## 2022-09-22 IMAGING — US ABDLM
1 series · 14 of 25 positions shown · non-contrast
Comparison: none

[Series 1: us abdomen limited · 14 of 65 slices shown]
[im 1/65]
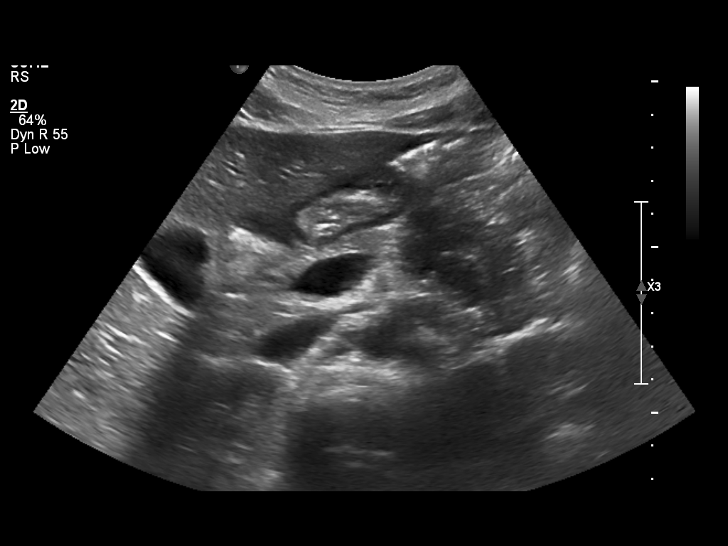
[im 6/65]
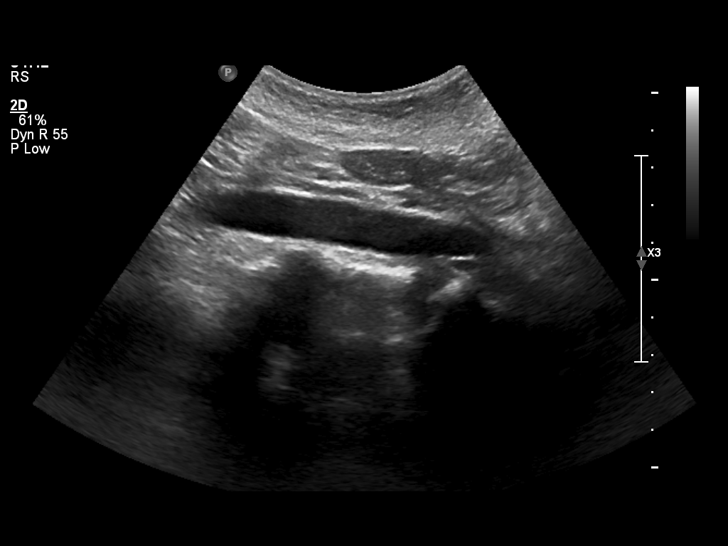
[im 11/65]
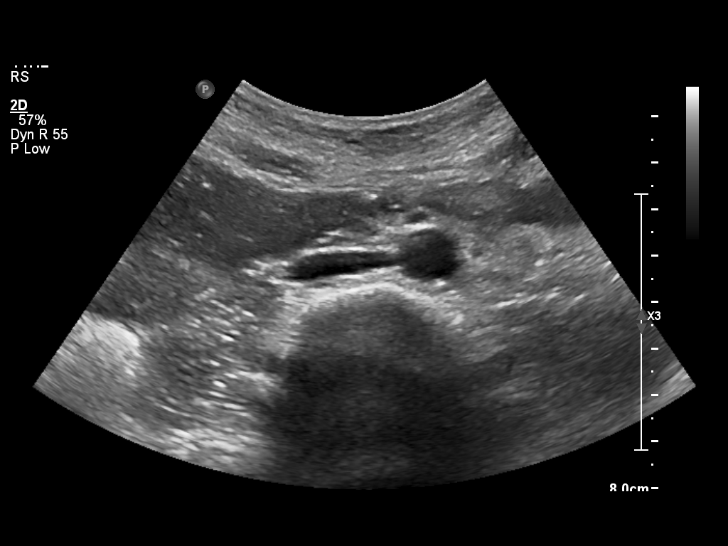
[im 17/65]
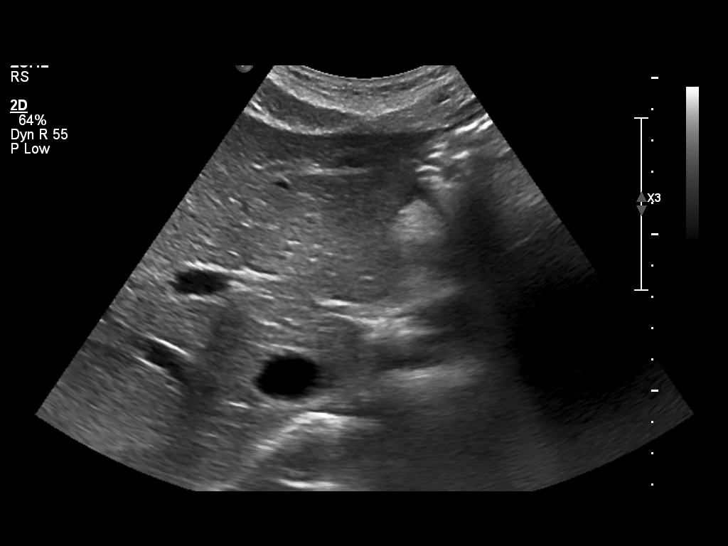
[im 22/65]
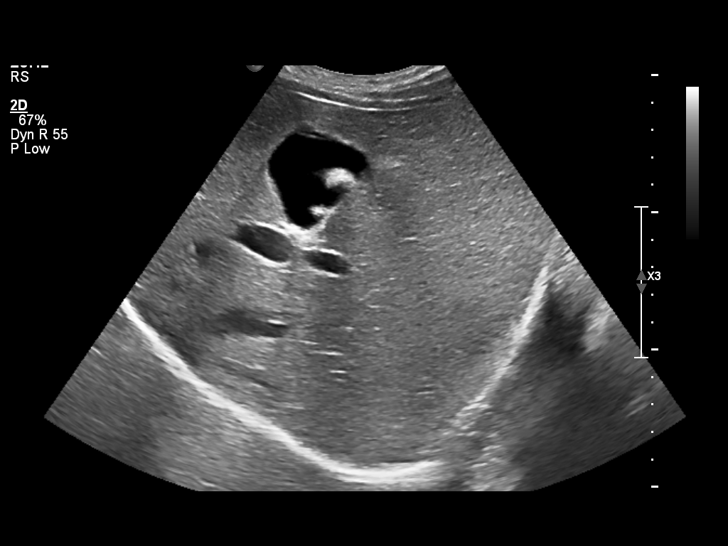
[im 25/65]
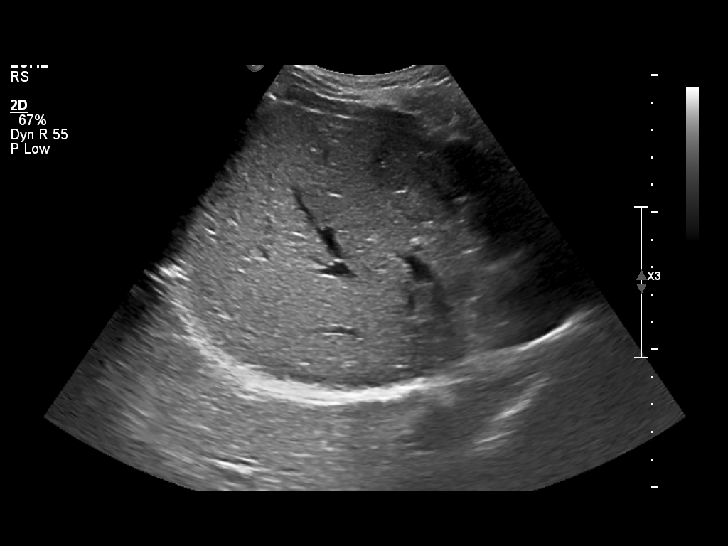
[im 30/65]
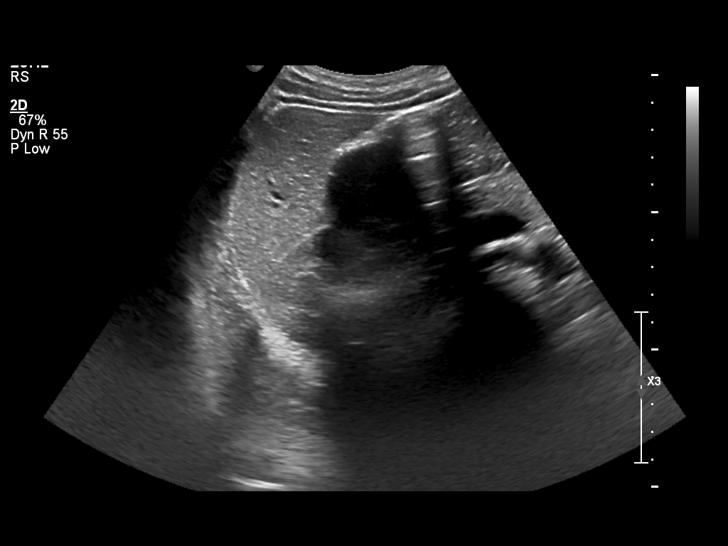
[im 35/65]
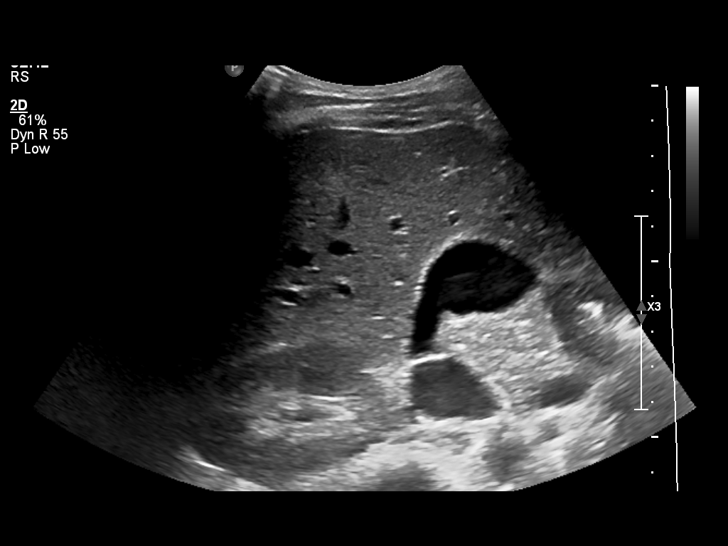
[im 41/65]
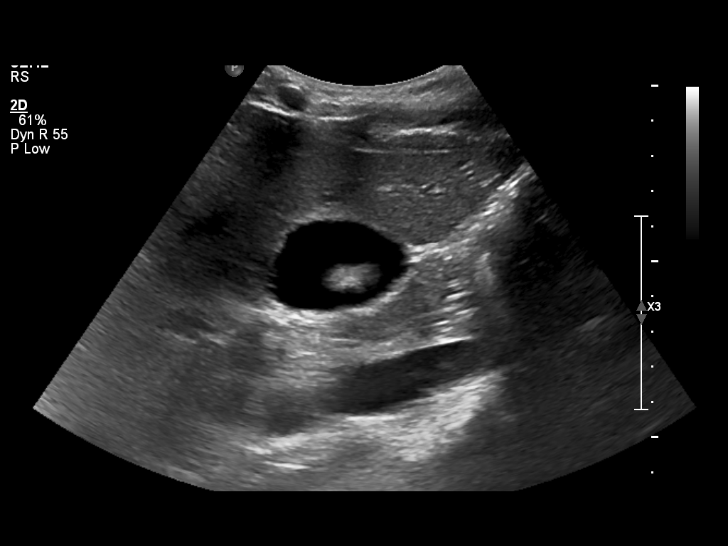
[im 43/65]
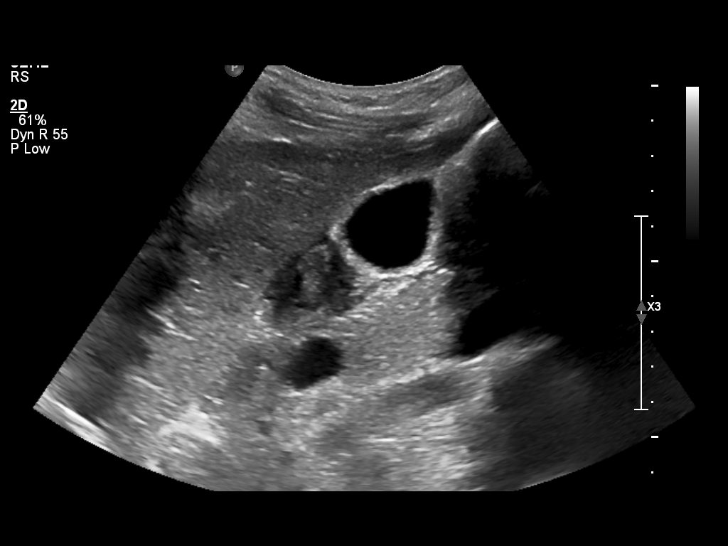
[im 49/65]
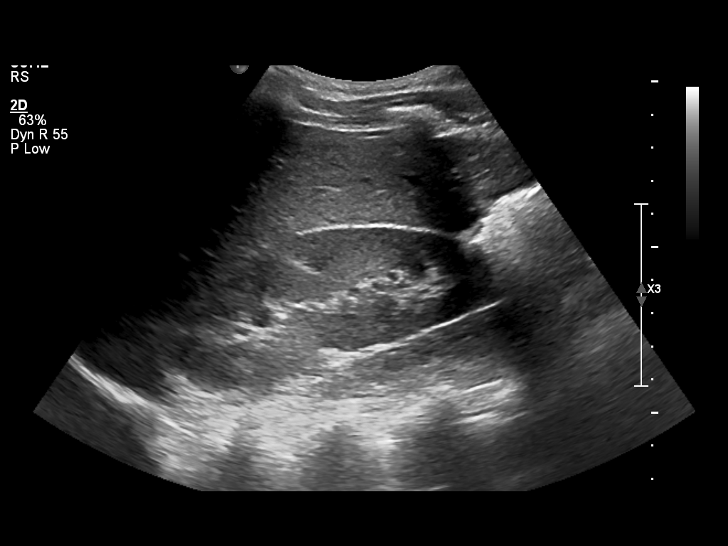
[im 54/65]
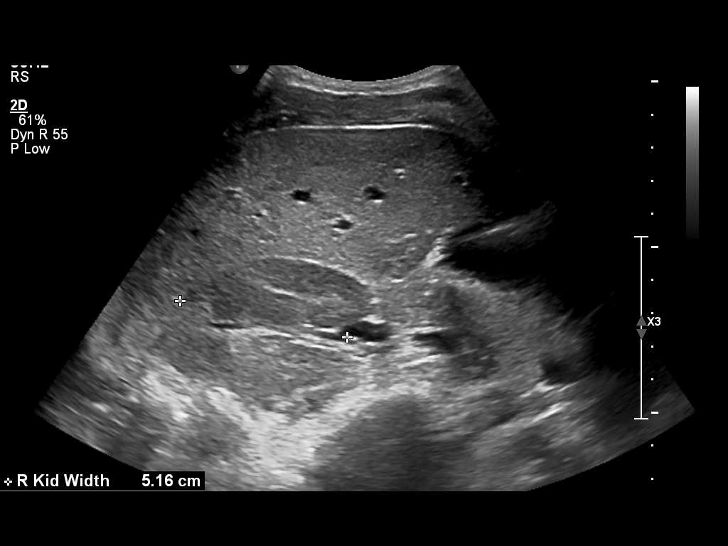
[im 59/65]
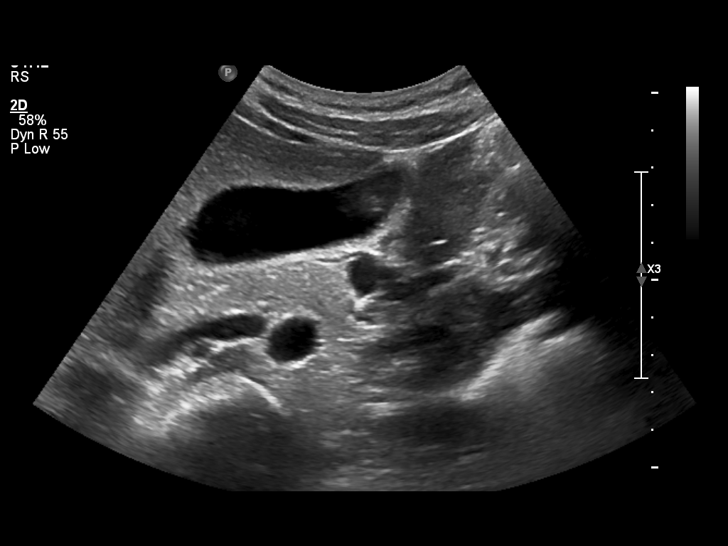
[im 65/65]
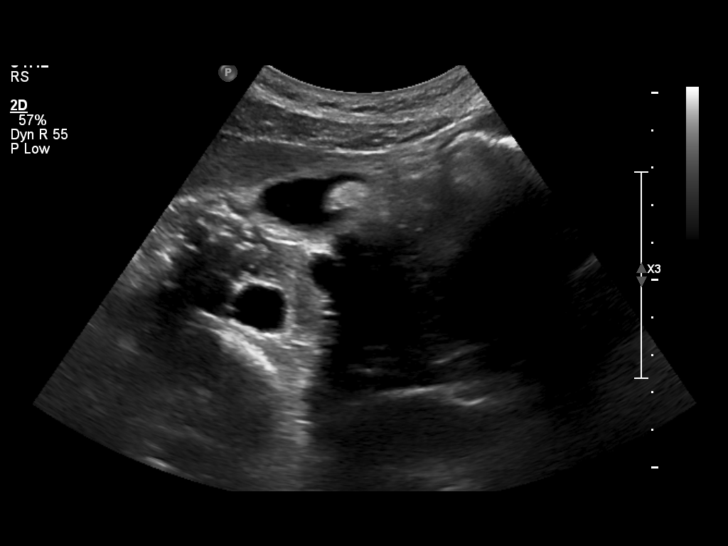

[14 of 25 positions shown; findings below may reference images not displayed]

DIAGNOSTIC STUDIES

EXAM

ULTRASOUND, ABDOMINAL, REAL TIME WITH IMAGE DOCUMENTATION, COMPLETE; CPT 48400

INDICATION

Elevated liver enzymes.

COMPARISONS

No priors available for comparison.

FINDINGS

The Liver is normal in size and echotexture. There is no evidence of intra or extrahepatic biliary
ductal dilatation.

There is a 1.7 cm gallstone within the gallbladder lumen.

Limited evaluation of the pancreas secondary to overlying bowel gas.

No evidence of right sided hydronephrosis or calculi.

The Aorta is normal in caliber and contour. There is no evidence of aneurismal dilatation.

The Inferior Vena Cava is patent.

IMPRESSION

Cholelithiasis.

Tech Notes:

TM

## 2023-02-15 ENCOUNTER — Encounter: Admit: 2023-02-15 | Discharge: 2023-02-15 | Payer: MEDICAID
